# Patient Record
Sex: Female | Born: 1950 | Race: White | Hispanic: No | Marital: Married | Smoking: Former smoker
Health system: Southern US, Community
[De-identification: ages and names within clinical notes are randomized; demographics above are authoritative.]

## PROBLEM LIST (undated history)

## (undated) DIAGNOSIS — Z9221 Personal history of antineoplastic chemotherapy: Secondary | ICD-10-CM

## (undated) DIAGNOSIS — C50919 Malignant neoplasm of unspecified site of unspecified female breast: Secondary | ICD-10-CM

## (undated) DIAGNOSIS — J984 Other disorders of lung: Secondary | ICD-10-CM

## (undated) HISTORY — PX: MASTECTOMY: SHX3

## (undated) HISTORY — PX: AUGMENTATION MAMMAPLASTY: SUR837

## (undated) HISTORY — DX: Malignant neoplasm of unspecified site of unspecified female breast: C50.919

## (undated) HISTORY — DX: Personal history of antineoplastic chemotherapy: Z92.21

## (undated) HISTORY — DX: Other disorders of lung: J98.4

---

## 2016-08-19 LAB — LIPID PANEL
Cholesterol: 241 mg/dL — AB (ref 0–200)
HDL: 70 mg/dL (ref 35–70)
LDL CALC: 137 mg/dL
LDL/HDL RATIO: 2
Triglycerides: 168 mg/dL — AB (ref 40–160)

## 2016-08-19 LAB — HEPATIC FUNCTION PANEL
ALT: 17 U/L (ref 7–35)
AST: 13 U/L (ref 13–35)
Alkaline Phosphatase: 77 U/L (ref 25–125)
Bilirubin, Total: 0.3 mg/dL

## 2016-08-19 LAB — CBC AND DIFFERENTIAL
HEMATOCRIT: 39 % (ref 36–46)
HEMOGLOBIN: 12.9 g/dL (ref 12.0–16.0)
Neutrophils Absolute: 4 /uL
Platelets: 334 10*3/uL (ref 150–399)
WBC: 7.7 10^3/mL

## 2016-08-19 LAB — BASIC METABOLIC PANEL
BUN: 17 mg/dL (ref 4–21)
Creatinine: 0.9 mg/dL (ref 0.5–1.1)
Glucose: 91 mg/dL
Potassium: 4.9 mmol/L (ref 3.4–5.3)
Sodium: 141 mmol/L (ref 137–147)

## 2016-09-11 LAB — HM MAMMOGRAPHY

## 2017-03-20 ENCOUNTER — Encounter: Payer: Self-pay | Admitting: Physician Assistant

## 2017-03-20 ENCOUNTER — Ambulatory Visit (INDEPENDENT_AMBULATORY_CARE_PROVIDER_SITE_OTHER): Payer: Federal, State, Local not specified - PPO | Admitting: Physician Assistant

## 2017-03-20 VITALS — BP 142/67 | HR 90 | Ht 68.0 in | Wt 194.0 lb

## 2017-03-20 DIAGNOSIS — Z1211 Encounter for screening for malignant neoplasm of colon: Secondary | ICD-10-CM

## 2017-03-20 DIAGNOSIS — Z800 Family history of malignant neoplasm of digestive organs: Secondary | ICD-10-CM | POA: Diagnosis not present

## 2017-03-20 DIAGNOSIS — R1010 Upper abdominal pain, unspecified: Secondary | ICD-10-CM

## 2017-03-20 DIAGNOSIS — Z860100 Personal history of colon polyps, unspecified: Secondary | ICD-10-CM

## 2017-03-20 DIAGNOSIS — K5904 Chronic idiopathic constipation: Secondary | ICD-10-CM

## 2017-03-20 MED ORDER — PLECANATIDE 3 MG PO TABS: 1.0000 | ORAL_TABLET | Freq: Every day | ORAL | 2 refills | Status: DC

## 2017-03-20 NOTE — Patient Instructions (Signed)
Plecanatide oral tablets What is this medicine? PLECANATIDE (ple kan a tide) is used to treat chronic constipation. This medicine may be used for other purposes; ask your health care provider or pharmacist if you have questions. COMMON BRAND NAME(S): TRULANCE What should I tell my health care provider before I take this medicine? They need to know if you have any of these conditions: -now have diarrhea or have diarrhea often -stomach or intestinal disease, including bowel obstruction or abdominal adhesions -an unusual or allergic reaction to plecanatide, other medicines, foods, dyes, or preservatives -pregnant or trying to get pregnant -breast-feeding How should I use this medicine? Take this medicine by mouth with a glass of water. Follow the directions on the prescription label. Swallow tablets whole. You can take this medicine with or without food. Take your medicine at regular intervals. Do not take your medicine more often than directed. Do not stop taking except on your doctor's advice. A special MedGuide will be given to you by the pharmacist with each prescription and refill. Be sure to read this information carefully each time. Talk to your pediatrician regarding the use of this medicine in children. This medicine is not approved for use in children. Overdosage: If you think you have taken too much of this medicine contact a poison control center or emergency room at once. NOTE: This medicine is only for you. Do not share this medicine with others. What if I miss a dose? If you miss a dose, just skip that dose. Wait until your next dose, and take only that dose. Do not take double or extra doses. What may interact with this medicine? -certain medicines for bowel problems or bladder incontinence (these can cause constipation) This list may not describe all possible interactions. Give your health care provider a list of all the medicines, herbs, non-prescription drugs, or dietary  supplements you use. Also tell them if you smoke, drink alcohol, or use illegal drugs. Some items may interact with your medicine. What should I watch for while using this medicine? Visit your doctor for regular check ups. Tell your doctor if your symptoms do not get better or if they get worse. Diarrhea is a common side effect of this medicine. It often begins within 2 weeks of starting this medicine. Stop taking this medicine and call your doctor if you get severe diarrhea. What side effects may I notice from receiving this medicine? Side effects that you should report to your doctor or health care professional as soon as possible: -allergic reactions like skin rash, itching or hives, swelling of the face, lips, or tongue -fever or chills; cough; sore throat; pain or trouble passing urine -severe or prolonged diarrhea Side effects that usually do not require medical attention (Report these to your doctor or health care professional if they continue or are bothersome): -bloating -gas This list may not describe all possible side effects. Call your doctor for medical advice about side effects. You may report side effects to FDA at 1-800-FDA-1088. Where should I keep my medicine? Keep out of the reach of children. Store at room temperature between 20 and 25 degrees C (68 and 77 degrees F). Keep this medicine in the original container. Keep tightly closed in a dry place. Do not remove the desiccant packet from the bottle, it helps to protect your medicine from moisture. Throw away any unused medicine after the expiration date. NOTE: This sheet is a summary. It may not cover all possible information. If you have questions about this  medicine, talk to your doctor, pharmacist, or health care provider.  2018 Elsevier/Gold Standard (2015-12-17 16:26:13)

## 2017-03-20 NOTE — Progress Notes (Signed)
   Subjective:    Patient ID: Angelica Hoover, female    DOB: 10-Feb-1951, 66 y.o.   MRN: [id number]  HPI  Pt is a 66 yo female who presents to the clinic to establish care.   .. Active Ambulatory Problems    Diagnosis Date Noted  . Chronic idiopathic constipation 03/22/2017  . History of colon polyps 03/22/2017  . Family history of colon cancer 03/22/2017  . Family history of pancreatic cancer 03/22/2017  . Pain of upper abdomen 03/22/2017   Resolved Ambulatory Problems    Diagnosis Date Noted  . No Resolved Ambulatory Problems   No Additional Past Medical History   Mother died of  colon cancer at age of  74. Father died of pancreatic cancer at age of 83.Brother had colon cancer and died at age 52.   Pt had completely normal bowel movements until colonoscopy 3 years ago. 2 polyps were found with repeat in 3 years colonscopy. Needs referall. Now bowel movements are hard and difficult to pass. She is on fibers and vitamins. She can go up to 10 days with no bowel movements. She is drinkin about 64oz of water a day. She drinks 2 cups of black tea and 2 cuffs of green tea a day. Tried miralax, probiotic, fiber chews, and colon cleanse. Denies any acid reflux. Denies any melena or hematochezia.       Review of Systems  All other systems reviewed and are negative.      Objective:   Physical Exam  Constitutional: She is oriented to person, place, and time. She appears well-developed and well-nourished.  HENT:  Head: Normocephalic and atraumatic.  Cardiovascular: Normal rate, regular rhythm and normal heart sounds.   Pulmonary/Chest: Effort normal and breath sounds normal.  Abdominal:  Slightly distended abdomen.  Tenderness diffusely to RUQ, epigastric area, in all 4 quandrants. No rebound or guarding.   Neurological: She is alert and oriented to person, place, and time.  Psychiatric: She has a normal mood and affect. Her behavior is normal.          Assessment & Plan:   Marland KitchenMarland KitchenDiagnoses and all orders for this visit:  Chronic idiopathic constipation -     US Abdomen Complete; Future -     Ambulatory referral to Gastroenterology  Family history of colon cancer -     Ambulatory referral to Gastroenterology  History of colon polyps -     Ambulatory referral to Gastroenterology  Family history of pancreatic cancer -     Ambulatory referral to Gastroenterology  Pain of upper abdomen -     US Abdomen Complete; Future -     Ambulatory referral to Gastroenterology  Colon cancer screening -     Ambulatory referral to Gastroenterology  Other orders -     Plecanatide (TRULANCE) 3 MG TABS; Take 1 tablet by mouth daily.  U/S to evaluate tenderness in upper abdomen.   Pt needs colonoscopy to follow up on polyps of colon.  For constipation trulance given.  Follow up with GI.  Symptomatic care discussed.

## 2017-03-22 DIAGNOSIS — Z800 Family history of malignant neoplasm of digestive organs: Secondary | ICD-10-CM | POA: Insufficient documentation

## 2017-03-22 DIAGNOSIS — R1010 Upper abdominal pain, unspecified: Secondary | ICD-10-CM | POA: Insufficient documentation

## 2017-03-22 DIAGNOSIS — K5904 Chronic idiopathic constipation: Secondary | ICD-10-CM | POA: Insufficient documentation

## 2017-03-22 DIAGNOSIS — Z86010 Personal history of colonic polyps: Secondary | ICD-10-CM | POA: Insufficient documentation

## 2017-03-24 ENCOUNTER — Telehealth: Payer: Self-pay | Admitting: Physician Assistant

## 2017-03-24 NOTE — Telephone Encounter (Signed)
I called Pharmacy to let them know Trulance was approved and they are going to fill medication and let patient know when it is ready for pick up. - CF

## 2017-03-24 NOTE — Telephone Encounter (Signed)
Received fax for PA on Trulance sent through cover my meds and received authorization from 02/22/17 - 03/24/18. - CF

## 2017-03-25 ENCOUNTER — Other Ambulatory Visit: Payer: Self-pay | Admitting: *Deleted

## 2017-03-26 ENCOUNTER — Other Ambulatory Visit: Payer: Federal, State, Local not specified - PPO

## 2017-04-02 ENCOUNTER — Encounter: Payer: Self-pay | Admitting: Physician Assistant

## 2017-04-07 LAB — HM COLONOSCOPY

## 2017-06-01 ENCOUNTER — Encounter: Payer: Self-pay | Admitting: Family Medicine

## 2017-06-01 ENCOUNTER — Ambulatory Visit (INDEPENDENT_AMBULATORY_CARE_PROVIDER_SITE_OTHER): Payer: Federal, State, Local not specified - PPO | Admitting: Family Medicine

## 2017-06-01 VITALS — BP 141/60 | HR 86 | Temp 97.7°F | Wt 195.0 lb

## 2017-06-01 DIAGNOSIS — H60392 Other infective otitis externa, left ear: Secondary | ICD-10-CM

## 2017-06-01 MED ORDER — FLUCONAZOLE 150 MG PO TABS: 150.0000 mg | ORAL_TABLET | Freq: Once | ORAL | 1 refills | Status: AC

## 2017-06-01 MED ORDER — CEFDINIR 300 MG PO CAPS: 300.0000 mg | ORAL_CAPSULE | Freq: Two times a day (BID) | ORAL | 0 refills | Status: DC

## 2017-06-01 MED ORDER — NEOMYCIN-POLYMYXIN-HC 1 % OT SOLN: 3.0000 [drp] | Freq: Four times a day (QID) | OTIC | 0 refills | Status: DC

## 2017-06-01 NOTE — Progress Notes (Signed)
Angelica Hoover is a 66 y.o. female who presents to [facility]: [facility] today for left ear pain. Patient has a one-week history of left-sided ear pain and swelling. Pain radiates to the jaw and is worse with chewing. She denies any fevers or chills vomiting or diarrhea. She has not tried any treatment yet. She cannot think of any injury but has been cleaning her ears with her fingernails and thinks she may have scraped her ear canal.   No past medical history on file. No past surgical history on file. Social History  Substance Use Topics  . Smoking status: Former Research scientist (life sciences)  . Smokeless tobacco: Never Used  . Alcohol use Yes   family history is not on file.  ROS as above:  Medications: Current Outpatient Prescriptions  Medication Sig Dispense Refill  . AMBULATORY NON FORMULARY MEDICATION 1,000 mg. Garcinia Cambogia    . Ascorbic Acid (VITAMIN C) 500 MG CAPS Take 1,000 mg by mouth daily.    . cefdinir (OMNICEF) 300 MG capsule Take 1 capsule (300 mg total) by mouth 2 (two) times daily. 14 capsule 0  . Cholecalciferol (VITAMIN D-3) 5000 units TABS by Does not apply route.    . fluconazole (DIFLUCAN) 150 MG tablet Take 1 tablet (150 mg total) by mouth once. 1 tablet 1  . GLUCOSAMINE SULFATE PO Frequency:   Dosage:0.0     Instructions:  Note:    . linaclotide (LINZESS) 290 MCG CAPS capsule Take 290 mcg by mouth daily.    Marland Kitchen lisinopril (PRINIVIL,ZESTRIL) 20 MG tablet Take 20 mg by mouth.    . NEOMYCIN-POLYMYXIN-HYDROCORTISONE (CORTISPORIN) 1 % SOLN OTIC solution Place 3 drops into the left ear 4 (four) times daily. 10 mL 0  . Probiotic Product ([employer] PROBIOTIC MAXIMUM STRENGTH) CAPS Take 1 capsule by mouth daily.    Marland Kitchen RA KRILL OIL 500 MG CAPS Frequency:   Dosage:0.0     Instructions:  Note:    . vitamin B-12 (CYANOCOBALAMIN) 1000 MCG tablet Take 1,000 mcg by mouth daily.    .  Vitamins/Minerals TABS Frequency:   Dosage:0.0     Instructions:  Note:    . gabapentin (NEURONTIN) 600 MG tablet Take 600 mg by mouth.    . pramipexole (MIRAPEX) 0.25 MG tablet Take 0.25 mg by mouth daily.     No current facility-administered medications for this visit.    Allergies  Allergen Reactions  . Aspirin   . Codeine   . Cortisone     Pending as of 02/15/15  . Penicillins     Health Maintenance Health Maintenance  Topic Date Due  . Hepatitis C Screening  July 30, 1951  . HIV Screening  08/22/1967  . TETANUS/TDAP  08/22/1971  . PAP SMEAR  08/21/1973  . MAMMOGRAM  08/21/2002  . COLONOSCOPY  08/21/2002  . INFLUENZA VACCINE  06/24/2017     Exam:  BP (!) 141/60 Comment: Taking Sudafed  Pulse 86   Temp 97.7 F (36.5 C) (Oral)   Wt 195 lb (88.5 kg)   SpO2 99%   BMI 29.65 kg/m  Gen: Well NAD HEENT: EOMI,  MMM Left external ear canal and area around the jaw is mildly swollen but nonerythematous. The ear canal is tender to touch. The mastoids are nontender bilaterally. The ear canal swollen but the tympanic membrane is visible and non-erythematous. No discharge. Right-sided ear is normal appearing no cervical lymphadenopathy present. Lungs: Normal work of breathing. CTABL Heart: RRR no  MRG Abd: NABS, Soft. Nondistended, Nontender Exts: Brisk capillary refill, warm and well perfused.    No results found for this or any previous visit (from the past 72 hour(s)). No results found.    Assessment and Plan: 66 y.o. female with otitis externa with potential cellulitis. Empiric treatment with Omnicef and Cortisporin ear drops. Patient has no anaphylactic history to penicillin and therefore I think that [place] will be fine. The allergy to steroids is lethargy with injectable steroids. I think these medications will be safe and effective. Recheck if not better.   No orders of the defined types were placed in this encounter.  Meds ordered this encounter  Medications  .  NEOMYCIN-POLYMYXIN-HYDROCORTISONE (CORTISPORIN) 1 % SOLN OTIC solution    Sig: Place 3 drops into the left ear 4 (four) times daily.    Dispense:  10 mL    Refill:  0  . cefdinir (OMNICEF) 300 MG capsule    Sig: Take 1 capsule (300 mg total) by mouth 2 (two) times daily.    Dispense:  14 capsule    Refill:  0  . fluconazole (DIFLUCAN) 150 MG tablet    Sig: Take 1 tablet (150 mg total) by mouth once.    Dispense:  1 tablet    Refill:  1  . linaclotide (LINZESS) 290 MCG CAPS capsule    Sig: Take 290 mcg by mouth daily.  . Probiotic Product (CVS PROBIOTIC MAXIMUM STRENGTH) CAPS    Sig: Take 1 capsule by mouth daily.  . Ascorbic Acid (VITAMIN C) 500 MG CAPS    Sig: Take 1,000 mg by mouth daily.  . vitamin B-12 (CYANOCOBALAMIN) 1000 MCG tablet    Sig: Take 1,000 mcg by mouth daily.  . pramipexole (MIRAPEX) 0.25 MG tablet    Sig: Take 0.25 mg by mouth daily.     Discussed warning signs or symptoms. Please see discharge instructions. Patient expresses understanding.

## 2017-06-01 NOTE — Patient Instructions (Signed)
Thank you for coming in today. Take the oral antibiotics twice daily for 1 week.  Apply the ear drops 4x daily for 1 week.  Take fluconazole pill if you have a yeast infection.   Recheck as needed especially if worsening or not improving.    Otitis Externa Otitis externa is an infection of the outer ear canal. The outer ear canal is the area between the outside of the ear and the eardrum. Otitis externa is sometimes called "swimmer's ear." What are the causes? This condition may be caused by:  Swimming in dirty water.  Moisture in the ear.  An injury to the inside of the ear.  An object stuck in the ear.  A cut or scrape on the outside of the ear.  What increases the risk? This condition is more likely to develop in swimmers. What are the signs or symptoms? The first symptom of this condition is often itching in the ear. Later signs and symptoms include:  Swelling of the ear.  Redness in the ear.  Ear pain. The pain may get worse when you pull on your ear.  Pus coming from the ear.  How is this diagnosed? This condition may be diagnosed by examining the ear and testing fluid from the ear for bacteria and funguses. How is this treated? This condition may be treated with:  Antibiotic ear drops. These are often given for 10-14 days.  Medicine to reduce itching and swelling.  Follow these instructions at home:  If you were prescribed antibiotic ear drops, apply them as told by your health care provider. Do not stop using the antibiotic even if your condition improves.  Take over-the-counter and prescription medicines only as told by your health care provider.  Keep all follow-up visits as told by your health care provider. This is important. How is this prevented?  Keep your ear dry. Use the corner of a towel to dry your ear after you swim or bathe.  Avoid scratching or putting things in your ear. Doing these things can damage the ear canal or remove the protective  wax that lines it, which makes it easier for bacteria and funguses to grow.  Avoid swimming in lakes, polluted water, or pools that may not have the right amount of chlorine.  Consider making ear drops and putting 3 or 4 drops in each ear after you swim. Ask your health care provider about how you can make ear drops. Contact a health care provider if:  You have a fever.  After 3 days your ear is still red, swollen, painful, or draining pus.  Your redness, swelling, or pain gets worse.  You have a severe headache.  You have redness, swelling, pain, or tenderness in the area behind your ear. This information is not intended to replace advice given to you by your health care provider. Make sure you discuss any questions you have with your health care provider. Document Released: 11/10/2005 Document Revised: 12/18/2015 Document Reviewed: 08/20/2015 Elsevier Interactive Patient Education  [employer].

## 2017-08-03 ENCOUNTER — Ambulatory Visit (INDEPENDENT_AMBULATORY_CARE_PROVIDER_SITE_OTHER): Payer: Federal, State, Local not specified - PPO | Admitting: Physician Assistant

## 2017-08-03 ENCOUNTER — Encounter: Payer: Self-pay | Admitting: Physician Assistant

## 2017-08-03 VITALS — BP 148/70 | HR 78 | Wt 187.0 lb

## 2017-08-03 DIAGNOSIS — G2581 Restless legs syndrome: Secondary | ICD-10-CM | POA: Diagnosis not present

## 2017-08-03 DIAGNOSIS — T50905A Adverse effect of unspecified drugs, medicaments and biological substances, initial encounter: Secondary | ICD-10-CM

## 2017-08-03 DIAGNOSIS — Z23 Encounter for immunization: Secondary | ICD-10-CM

## 2017-08-03 DIAGNOSIS — Z853 Personal history of malignant neoplasm of breast: Secondary | ICD-10-CM | POA: Insufficient documentation

## 2017-08-03 DIAGNOSIS — G4733 Obstructive sleep apnea (adult) (pediatric): Secondary | ICD-10-CM

## 2017-08-03 DIAGNOSIS — T887XXA Unspecified adverse effect of drug or medicament, initial encounter: Secondary | ICD-10-CM

## 2017-08-03 MED ORDER — ROPINIROLE HCL 0.25 MG PO TABS: ORAL_TABLET | ORAL | 1 refills | Status: DC

## 2017-08-03 NOTE — Patient Instructions (Addendum)
Restless Legs Syndrome Restless legs syndrome is a condition that causes uncomfortable feelings or sensations in the legs, especially while sitting or lying down. The sensations usually cause an overwhelming urge to move the legs. The arms can also sometimes be affected. The condition can range from mild to severe. The symptoms often interfere with a person's ability to sleep. What are the causes? The cause of this condition is not known. What increases the risk? This condition is more likely to develop in:  People who are older than age 51.  Pregnant women. In general, restless legs syndrome is more common in women than in men.  People who have a family history of the condition.  People who have certain medical conditions, such as iron deficiency, kidney disease, Parkinson disease, or nerve damage.  People who take certain medicines, such as medicines for high blood pressure, nausea, colds, allergies, depression, and some heart conditions.  What are the signs or symptoms? The main symptom of this condition is uncomfortable sensations in the legs. These sensations may be:  Described as pulling, tingling, prickling, throbbing, crawling, or burning.  Worse while you are sitting or lying down.  Worse during periods of rest or inactivity.  Worse at night, often interfering with your sleep.  Accompanied by a very strong urge to move your legs.  Temporarily relieved by movement of your legs.  The sensations usually affect both sides of the body. The arms can also be affected, but this is rare. People who have this condition often have tiredness during the day because of their lack of sleep at night. How is this diagnosed? This condition may be diagnosed based on your description of the symptoms. You may also have tests, including blood tests, to check for other conditions that may lead to your symptoms. In some cases, you may be asked to spend some time in a sleep lab so your sleeping  can be monitored. How is this treated? Treatment for this condition is focused on managing the symptoms. Treatment may include:  Self-help and lifestyle changes.  Medicines.  Follow these instructions at home:  Take medicines only as directed by your health care provider.  Try these methods to get temporary relief from the uncomfortable sensations: ? Massage your legs. ? Walk or stretch. ? Take a cold or hot bath.  Practice good sleep habits. For example, go to bed and get up at the same time every day.  Exercise regularly.  Practice ways of relaxing, such as yoga or meditation.  Avoid caffeine and alcohol.  Do not use any tobacco products, including cigarettes, chewing tobacco, or electronic cigarettes. If you need help quitting, ask your health care provider.  Keep all follow-up visits as directed by your health care provider. This is important. Contact a health care provider if: Your symptoms do not improve with treatment, or they get worse. This information is not intended to replace advice given to you by your health care provider. Make sure you discuss any questions you have with your health care provider. Document Released: 10/31/2002 Document Revised: 04/17/2016 Document Reviewed: 11/06/2014 Elsevier Interactive Patient Education  2018 [employer].   Ropinirole tablets What is this medicine? ROPINIROLE (roe PIN i role) is used to treat the symptoms of Parkinson's disease. It helps to improve muscle control and movement difficulties. It is also used for the treatment of Restless Legs Syndrome. This medicine may be used for other purposes; ask your health care provider or pharmacist if you have questions. COMMON  BRAND NAME(S): Requip What should I tell my health care provider before I take this medicine? They need to know if you have any of these conditions: -dizzy or fainting spells -heart disease -high blood pressure -kidney disease -liver disease -low blood  pressure -sleeping problems -an unusual or allergic reaction to ropinirole, other medicines, foods, dyes, or preservatives -pregnant or trying to get pregnant -breast-feeding How should I use this medicine? Take this medicine by mouth with a glass of water. Follow the directions on the prescription label. You can take it with or without food. If it upsets your stomach, take it with food. Take your doses at regular intervals. Do not take your medicine more often than directed. Do not stop taking this medicine except on your doctor's advice. Stopping this medicine too quickly may cause serious side effects. Talk to your pediatrician regarding the use of this medicine in children. Special care may be needed. Overdosage: If you think you have taken too much of this medicine contact a poison control center or emergency room at once. NOTE: This medicine is only for you. Do not share this medicine with others. What if I miss a dose? If you miss a dose, take it as soon as you can. If it is almost time for your next dose, take only that dose. Do not take double or extra doses. What may interact with this medicine? -ciprofloxacin -female hormones, like estrogens and birth control pills -medicines for depression, anxiety, or psychotic disturbances -metoclopramide -mexiletine -norfloxacin -omeprazole This list may not describe all possible interactions. Give your health care provider a list of all the medicines, herbs, non-prescription drugs, or dietary supplements you use. Also tell them if you smoke, drink alcohol, or use illegal drugs. Some items may interact with your medicine. What should I watch for while using this medicine? Visit your doctor or health care professional for regular checks on your progress. It may be several weeks or months before you feel the full effect of this medicine. You may get drowsy or dizzy. Do not drive, use machinery, or do anything that needs mental alertness until you  know how this drug affects you. Do not stand or sit up quickly, especially if you are an older patient. This reduces the risk of dizzy or fainting spells. Alcohol can increase possible dizziness. Avoid alcoholic drinks. If you find that you have sudden feelings of wanting to sleep during normal activities, like cooking, watching television, or while driving or riding in a car, you should contact your health care professional. Your mouth may get dry. Chewing sugarless gum or sucking hard candy, and drinking plenty of water may help. Contact your doctor if the problem does not go away or is severe. There have been reports of increased sexual urges or other strong urges such as gambling while taking some medicines for Parkinson's disease. If you experience any of these urges while taking this medicine, you should report it to your health care provider as soon as possible. You should check your skin often for changes to moles and new growths while taking this medicine. Call your doctor if you notice any of these changes. What side effects may I notice from receiving this medicine? Side effects that you should report to your doctor or health care professional as soon as possible: -allergic reactions like skin rash, itching or hives, swelling of the face, lips, or tongue -changes in vision -chest pain -confusion -falling asleep during normal activities like driving -fast, irregular heartbeat -feeling faint  or lightheaded, falls -hallucination, loss of contact with reality -joint or muscle pain -loss of bladder control -loss of memory -new or increased gambling urges, sexual urges, uncontrolled spending, binge or compulsive eating, or other urges -pain, tingling, numbness in the hands or feet -shortness of breath, troubled breathing, tightness in chest, or wheezing -signs and symptoms of low blood pressure like dizziness; feeling faint or lightheaded, falls; unusually weak or tired -swelling of the  ankles, feet, hands -uncontrollable head, mouth, neck, arm, or leg movements -vomiting Side effects that usually do not require medical attention (report to your doctor or health care professional if they continue or are bothersome): -dizziness -drowsiness -headache -increased sweating -nausea -tremors This list may not describe all possible side effects. Call your doctor for medical advice about side effects. You may report side effects to FDA at 1-800-FDA-1088. Where should I keep my medicine? Keep out of the reach of children. Store at room temperature between 20 and 25 degrees C (68 and 77 degrees F). Protect from light and moisture. Keep container tightly closed. Throw away any unused medicine after the expiration date. NOTE: This sheet is a summary. It may not cover all possible information. If you have questions about this medicine, talk to your doctor, pharmacist, or health care provider.  2018 Elsevier/Gold Standard (2016-04-28 10:58:05)

## 2017-08-03 NOTE — Progress Notes (Signed)
   Subjective:    Patient ID: Angelica Hoover, female    DOB: 1951-08-14, 66 y.o.   MRN: [id number]  HPI  Pt is a 66 yo female who presents to the clinic to discuss medication. Dr. Rollene Rotunda, her sleep apena provider gave her mirapex to try to replace gabapentin for her RLS. Gabapentin was working great but had gained 30lbs. She has since lost almost 20lbs after being off gabapentin. She started mirapex but she had lost of side effects such as constipation, insomnia, CP. All resolved after stopping. She now needs something for RLS.                                                            .. Active Ambulatory Problems    Diagnosis Date Noted  . Chronic idiopathic constipation 03/22/2017  . History of colon polyps 03/22/2017  . Family history of colon cancer 03/22/2017  . Family history of pancreatic cancer 03/22/2017  . Pain of upper abdomen 03/22/2017  . RLS (restless legs syndrome) 08/03/2017  . History of left breast cancer 08/03/2017  . OSA (obstructive sleep apnea) 08/05/2017   Resolved Ambulatory Problems    Diagnosis Date Noted  . No Resolved Ambulatory Problems   No Additional Past Medical History      Review of Systems  All other systems reviewed and are negative.      Objective:   Physical Exam  Constitutional: She is oriented to person, place, and time. She appears well-developed and well-nourished.  HENT:  Head: Normocephalic and atraumatic.  Cardiovascular: Normal rate, regular rhythm and normal heart sounds.   Pulmonary/Chest: Effort normal and breath sounds normal.  Neurological: She is alert and oriented to person, place, and time.          Assessment & Plan:  Marland KitchenMarland KitchenPatsy was seen today for reaction to mirapex.  Diagnoses and all orders for this visit:  Adverse effect of drug, initial encounter  RLS (restless legs syndrome) -     rOPINIRole (REQUIP) 0.25 MG tablet; Take one to four tablets as needed 1 hour before bed for RLS.  Need for immunization against  influenza -     Flu Vaccine QUAD 36+ mos IM  OSA (obstructive sleep apnea)   .Marland Kitchen Depression screen [facility] 2/9 08/03/2017 03/20/2017  Decreased Interest 0 0  Down, Depressed, Hopeless 0 0  PHQ - 2 Score 0 0   Will try requip. Discussed side effects. Can take up to 4 tablets. HO on other conservative treatments.  Noted other medication reactions.   Ok to make referral to another sleep apnea provider if needs referral. Her doctor is changing practices.

## 2017-08-05 ENCOUNTER — Telehealth: Payer: Self-pay | Admitting: Physician Assistant

## 2017-08-05 DIAGNOSIS — Z9989 Dependence on other enabling machines and devices: Secondary | ICD-10-CM | POA: Insufficient documentation

## 2017-08-05 DIAGNOSIS — G4733 Obstructive sleep apnea (adult) (pediatric): Secondary | ICD-10-CM | POA: Insufficient documentation

## 2017-08-05 NOTE — Telephone Encounter (Signed)
Need mammogram from [facility] in [place]

## 2017-08-18 NOTE — Telephone Encounter (Signed)
Request faxed

## 2017-08-28 ENCOUNTER — Ambulatory Visit (INDEPENDENT_AMBULATORY_CARE_PROVIDER_SITE_OTHER): Payer: Federal, State, Local not specified - PPO | Admitting: Physician Assistant

## 2017-08-28 ENCOUNTER — Encounter: Payer: Self-pay | Admitting: Physician Assistant

## 2017-08-28 VITALS — BP 132/76 | HR 93 | Ht 68.0 in | Wt 187.5 lb

## 2017-08-28 DIAGNOSIS — M170 Bilateral primary osteoarthritis of knee: Secondary | ICD-10-CM | POA: Insufficient documentation

## 2017-08-28 DIAGNOSIS — G2581 Restless legs syndrome: Secondary | ICD-10-CM

## 2017-08-28 DIAGNOSIS — L2389 Allergic contact dermatitis due to other agents: Secondary | ICD-10-CM | POA: Diagnosis not present

## 2017-08-28 DIAGNOSIS — L719 Rosacea, unspecified: Secondary | ICD-10-CM | POA: Insufficient documentation

## 2017-08-28 DIAGNOSIS — I10 Essential (primary) hypertension: Secondary | ICD-10-CM | POA: Insufficient documentation

## 2017-08-28 MED ORDER — LISINOPRIL 20 MG PO TABS: 20.0000 mg | ORAL_TABLET | Freq: Every day | ORAL | 3 refills | Status: DC

## 2017-08-28 MED ORDER — HYDROCORTISONE 2.5 % EX OINT: TOPICAL_OINTMENT | CUTANEOUS | 1 refills | Status: DC

## 2017-08-28 MED ORDER — ROPINIROLE HCL 0.5 MG PO TABS: ORAL_TABLET | ORAL | 1 refills | Status: DC

## 2017-08-28 NOTE — Patient Instructions (Addendum)
Rosacea Rosacea is a long-term (chronic) condition that affects the skin of the face, including the cheeks, nose, brow, and chin. This condition can also affect the eyes. Rosacea causes blood vessels near the surface of the skin to enlarge, which results in redness. What are the causes? The cause of this condition is not known. Certain triggers can make rosacea worse, including:  Hot baths.  Exercise.  Sunlight.  Very hot or cold temperatures.  Hot or spicy foods and drinks.  Drinking alcohol.  Stress.  Taking blood pressure medicine.  Long-term use of topical steroids on the face.  What increases the risk? This condition is more likely to develop in:  People who are older than 65 years of age.  Women.  People who have light-colored skin (light complexion).  People who have a family history of rosacea.  What are the signs or symptoms? Symptoms of this condition include:  Redness of the face.  Red bumps or pimples on the face.  A red, enlarged nose.  Blushing easily.  Red lines on the skin.  Irritated or burning feeling in the eyes.  Swollen eyelids.  How is this diagnosed? This condition is diagnosed with a medical history and physical exam. How is this treated? There is no cure for this condition, but treatment can help to control your symptoms. Your health care provider may recommend that you see a skin specialist (dermatologist). Treatment may include:  Antibiotic medicines that are applied to the skin or taken as a pill.  Laser treatment to improve the appearance of the skin.  Surgery. This is rare.  Your health care provider will also recommend the best way to take care of your skin. Even after your skin improves, you will likely need to continue treatment to prevent your rosacea from coming back. Follow these instructions at home: Skin Care Take care of your skin as told by your health care provider. You may be told to do these things:  Wash  your skin gently two or more times each day.  Use mild soap.  Use a sunscreen or sunblock with SPF 30 or greater.  Use gentle cosmetics that are meant for sensitive skin.  Shave with an electric shaver instead of a blade.  Lifestyle  Try to keep track of what foods trigger this condition. Avoid any triggers. These may include: ? Spicy foods. ? Seafood. ? Cheese. ? Hot liquids. ? Nuts. ? Chocolate. ? Iodized salt.  Do not drink alcohol.  Avoid extremely cold or hot temperatures.  Try to reduce your stress. If you need help, talk with your health care provider.  When you exercise, do these things to stay cool: ? Limit your sun exposure. ? Use a fan. ? Do shorter and more frequent intervals of exercise. General instructions  Keep all follow-up visits as told by your health care provider. This is important.  Take over-the-counter and prescription medicines only as told by your health care provider.  If your eyelids are affected, apply warm compresses to them. Do this as told by your health care provider.  If you were prescribed an antibiotic medicine, apply or take it as told by your health care provider. Do not stop using the antibiotic even if your condition improves. Contact a health care provider if:  Your symptoms get worse.  Your symptoms do not improve after two months of treatment.  You have new symptoms.  You have any changes in vision or you have problems with your eyes, such as   redness or itching.  You feel depressed.  You lose your appetite.  You have trouble concentrating. This information is not intended to replace advice given to you by your health care provider. Make sure you discuss any questions you have with your health care provider. Document Released: 12/18/2004 Document Revised: 04/17/2016 Document Reviewed: 01/17/2015 Elsevier Interactive Patient Education  2018 [place] 500mg  twice a day.

## 2017-08-28 NOTE — Progress Notes (Addendum)
Subjective:    Patient ID: Angelica Hoover, female    DOB: 03-28-1951, 66 y.o.   MRN: [id number]  HPI The patient is a pleasant 66 year old female here today to discuss her restless leg medication, refill lisinopril, and discuss facial acne. She states that her restless leg syndrome occurs throughout the day and at night. She has tried taking her Requip at night as directed, but she also experiences RLS symptoms during the day. She has tried taking two requip in the afternoon and two at night and this has offered some relief of symptoms during the day. She reports that occasionally she is unable to sleep at night because of her symptoms.   Her BP is well controlled on Lisinopril. BP today was 132/76 today.  She also mentioned what she describes as "acne" on the right side of her face. She states that it began one week ago. She believes that a pillow spray she used last week may have caused the reaction. She reports itching and some discomfort of the face. She reports a history of rosacea. She uses Elemis face wash, which she says has helped. She has limited her use of makeup as well.   .. Active Ambulatory Problems    Diagnosis Date Noted  . Chronic idiopathic constipation 03/22/2017  . History of colon polyps 03/22/2017  . Family history of colon cancer 03/22/2017  . Family history of pancreatic cancer 03/22/2017  . Pain of upper abdomen 03/22/2017  . RLS (restless legs syndrome) 08/03/2017  . History of left breast cancer 08/03/2017  . OSA (obstructive sleep apnea) 08/05/2017  . Hypertension 08/28/2017  . Primary osteoarthritis of both knees 08/28/2017  . 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 Rosacea 08/28/2017   Resolved Ambulatory Problems    Diagnosis Date Noted  . No Resolved Ambulatory Problems   No Additional Past Medical History    Review of Systems  All other systems reviewed and are negative.      Objective:   Physical Exam   Constitutional: She is oriented to person, place, and time. She appears well-developed and well-nourished.  HENT:  Head: Normocephalic and atraumatic.  Cardiovascular: Normal rate, regular rhythm and normal heart sounds.   Pulmonary/Chest: Effort normal and breath sounds normal.  Neurological: She is alert and oriented to person, place, and time.  Skin: Skin is warm and dry.  Contact dermatitis on the right side of the face that appears to be healing.   No pustules or comedones.   Psychiatric: She has a normal mood and affect. Her behavior is normal.      Assessment & Plan:  Marland KitchenMarland KitchenPatsy was seen today for medication refill.  Diagnoses and all orders for this visit:  Allergic contact dermatitis due to other agents -     hydrocortisone 2.5 % ointment; Apply to affected area BID.  RLS (restless legs syndrome) -     rOPINIRole (REQUIP) 0.5 MG tablet; Take one-two tablets up to three times a day.  Essential hypertension -     lisinopril (PRINIVIL,ZESTRIL) 20 MG tablet; Take 1 tablet (20 mg total) by mouth daily.  Primary osteoarthritis of both knees  Rosacea  Refill of lisinopril given today. I suspect a contact dermatitis as a result of the pillow spray she used. We will try hydrocortisone ointment mixed in with her moisturizer. We increased the dose of Requip today to 0.5mg . She may take one to two tablets up to three times per day for her  RLS.   Ho given for rosacea triggers. Follow up if would like to have medication treatment.   We also discussed bilateral knee OA. We discussed Orthovisc injections to help with the pain and OA, however given her allergy to cortisone she is hesitant. She will contact her orthopedist to discuss these injections.

## 2017-09-11 ENCOUNTER — Encounter: Payer: Self-pay | Admitting: Physician Assistant

## 2017-10-21 ENCOUNTER — Other Ambulatory Visit: Payer: Self-pay | Admitting: Physician Assistant

## 2017-10-21 DIAGNOSIS — G2581 Restless legs syndrome: Secondary | ICD-10-CM

## 2017-10-29 ENCOUNTER — Other Ambulatory Visit: Payer: Self-pay | Admitting: Physician Assistant

## 2017-10-29 ENCOUNTER — Telehealth: Payer: Self-pay | Admitting: Physician Assistant

## 2017-10-29 DIAGNOSIS — Z780 Asymptomatic menopausal state: Secondary | ICD-10-CM

## 2017-10-29 DIAGNOSIS — Z1239 Encounter for other screening for malignant neoplasm of breast: Secondary | ICD-10-CM

## 2017-10-29 NOTE — Telephone Encounter (Signed)
-----   Message from Leana Roe, RT sent at 10/29/2017  1:21 PM EST ----- Regarding: Bone Density Order Pt called and wanted to schedule her Mammo and Bone Density together. I put the order in just to make it easier on the patient. Would you mind signing off on it please.   Thank you  Apolonio Schneiders

## 2017-11-04 ENCOUNTER — Ambulatory Visit: Payer: Federal, State, Local not specified - PPO

## 2017-11-04 ENCOUNTER — Ambulatory Visit (INDEPENDENT_AMBULATORY_CARE_PROVIDER_SITE_OTHER): Payer: Federal, State, Local not specified - PPO

## 2017-11-04 ENCOUNTER — Other Ambulatory Visit: Payer: Federal, State, Local not specified - PPO

## 2017-11-04 ENCOUNTER — Other Ambulatory Visit: Payer: Self-pay | Admitting: Physician Assistant

## 2017-11-04 ENCOUNTER — Encounter: Payer: Self-pay | Admitting: Physician Assistant

## 2017-11-04 DIAGNOSIS — Z1231 Encounter for screening mammogram for malignant neoplasm of breast: Secondary | ICD-10-CM

## 2017-11-04 DIAGNOSIS — Z1239 Encounter for other screening for malignant neoplasm of breast: Secondary | ICD-10-CM

## 2017-11-04 DIAGNOSIS — M810 Age-related osteoporosis without current pathological fracture: Secondary | ICD-10-CM | POA: Diagnosis not present

## 2017-11-04 DIAGNOSIS — M85852 Other specified disorders of bone density and structure, left thigh: Secondary | ICD-10-CM

## 2017-11-04 NOTE — Telephone Encounter (Signed)
Call pt: osteoporosis via bone density. You should consider medications to increase bone strength such as fosamax or prolia. Would you like to come in and discuss options/side effects/risk/benefits?

## 2017-11-04 NOTE — Progress Notes (Signed)
Call pt: normal mammogram. Follow up in 1 year.

## 2017-11-11 ENCOUNTER — Other Ambulatory Visit: Payer: Federal, State, Local not specified - PPO

## 2017-12-10 ENCOUNTER — Other Ambulatory Visit: Payer: Self-pay | Admitting: *Deleted

## 2017-12-14 ENCOUNTER — Other Ambulatory Visit: Payer: Self-pay | Admitting: Physician Assistant

## 2017-12-14 DIAGNOSIS — G2581 Restless legs syndrome: Secondary | ICD-10-CM

## 2018-02-11 ENCOUNTER — Other Ambulatory Visit: Payer: Self-pay | Admitting: *Deleted

## 2018-02-11 DIAGNOSIS — G2581 Restless legs syndrome: Secondary | ICD-10-CM

## 2018-02-11 MED ORDER — ROPINIROLE HCL 0.5 MG PO TABS: ORAL_TABLET | ORAL | 2 refills | Status: DC

## 2018-05-25 ENCOUNTER — Other Ambulatory Visit: Payer: Self-pay | Admitting: Physician Assistant

## 2018-05-25 DIAGNOSIS — G2581 Restless legs syndrome: Secondary | ICD-10-CM

## 2018-05-28 ENCOUNTER — Other Ambulatory Visit: Payer: Self-pay

## 2018-05-28 DIAGNOSIS — G2581 Restless legs syndrome: Secondary | ICD-10-CM

## 2018-05-28 MED ORDER — ROPINIROLE HCL 0.5 MG PO TABS: ORAL_TABLET | ORAL | 2 refills | Status: DC

## 2018-05-28 NOTE — Telephone Encounter (Signed)
Pt was using [facility], so [place] needs script for medication. Medication is pended with correct pharmacy. -[place]

## 2018-07-01 ENCOUNTER — Other Ambulatory Visit: Payer: Self-pay

## 2018-07-01 ENCOUNTER — Emergency Department
Admission: EM | Admit: 2018-07-01 | Discharge: 2018-07-01 | Disposition: A | Discharge status: Home / Self Care | Payer: Federal, State, Local not specified - PPO | Source: Home / Self Care

## 2018-07-01 ENCOUNTER — Telehealth: Payer: Self-pay

## 2018-07-01 ENCOUNTER — Encounter: Payer: Self-pay | Admitting: Emergency Medicine

## 2018-07-01 DIAGNOSIS — S29012A Strain of muscle and tendon of back wall of thorax, initial encounter: Secondary | ICD-10-CM

## 2018-07-01 MED ORDER — TRAMADOL HCL 50 MG PO TABS: 50.0000 mg | ORAL_TABLET | Freq: Four times a day (QID) | ORAL | 0 refills | Status: DC | PRN

## 2018-07-01 MED ORDER — PREDNISONE 20 MG PO TABS: ORAL_TABLET | ORAL | 1 refills | Status: DC

## 2018-07-01 MED ORDER — CYCLOBENZAPRINE HCL 5 MG PO TABS: 5.0000 mg | ORAL_TABLET | Freq: Every day | ORAL | 0 refills | Status: DC

## 2018-07-01 NOTE — ED Triage Notes (Signed)
Yesterday noticed soreness in neck which worsened today and radiates from base of neck to right shoulder and down spine to waist.

## 2018-07-01 NOTE — Telephone Encounter (Signed)
Pt left msg stating that she would like muscle relaxer called in for a possible pulled muscle.  Called pt back, she reports she woke up with some pain and tension in her neck, down her back, and in her shoulder blades. Denied any injury or trauma. Pt does report she has some pain when taking deep breaths.  I advised pt she needs to be evaluated, sooner rather than later. Pt thinks it is badly pulled muscle but I advised her these could be cardiac SX, especially since it hurts to breathe at times, and generally you do not get pulled muscle from sleeping wrong.  Pt hesitant to be evaluated, but did finally agree to at least go be seen by urgent care. FYI to Covering provider since [place] out of office

## 2018-07-01 NOTE — Telephone Encounter (Signed)
Agree with above 

## 2018-07-01 NOTE — ED Provider Notes (Addendum)
[facility]   [id number] 07/01/18 Arrival Time: 2993   SUBJECTIVE:  Angelica Hoover is a 67 y.o. female who presents to the urgent care with complaint of Yesterday soreness in neck which worsened today and radiates from base of neck to right shoulder and down spine to waist.  Patient had been loading cases of drinks into her trunk when she felt the initial spasm.  It is gotten much more tight and painful over the last 24 hours.  She is tried Tylenol and ibuprofen with no improvement.     Past Medical History:  Diagnosis Date  . Breast cancer ([place])    Left Breast   . Personal history of chemotherapy    No family history on file. Social History   Socioeconomic History  . Marital status: Married    Spouse name: Not on file  . Number of children: Not on file  . Years of education: Not on file  . Highest education level: Not on file  Occupational History  . Not on file  Social Needs  . Financial resource strain: Not on file  . Food insecurity:    Worry: Not on file    Inability: Not on file  . Transportation needs:    Medical: Not on file    Non-medical: Not on file  Tobacco Use  . Smoking status: Former Research scientist (life sciences)  . Smokeless tobacco: Never Used  Substance and Sexual Activity  . Alcohol use: Yes  . Drug use: No  . Sexual activity: Not on file  Lifestyle  . Physical activity:    Days per week: Not on file    Minutes per session: Not on file  . Stress: Not on file  Relationships  . Social connections:    Talks on phone: Not on file    Gets together: Not on file    Attends religious service: Not on file    Active member of club or organization: Not on file    Attends meetings of clubs or organizations: Not on file    Relationship status: Not on file  . Intimate partner violence:    Fear of current or ex partner: Not on file    Emotionally abused: Not on file    Physically abused: Not on file    Forced sexual activity: Not on file  Other Topics Concern  .  Not on file  Social History Narrative  . Not on file   No outpatient medications have been marked as taking for the 07/01/18 encounter [facility] Encounter).   Allergies  Allergen Reactions  . Aspirin   . Codeine   . Cortisone     Pending as of 02/15/15  . Gabapentin     Weight gain.   Christie Nottingham [Pramipexole Dihydrochloride]     Constipated/insomnia/chest pain  . Penicillins       ROS: As per HPI, remainder of ROS negative.   OBJECTIVE:   Vitals:   07/01/18 1718 07/01/18 1720  BP:  (!) 181/84  Pulse: 80   Temp: 97.8 F (36.6 C)   TempSrc: Oral   SpO2:  100%     General appearance: alert; no distress Eyes: PERRL; EOMI; conjunctiva normal HENT: normocephalic; atraumatic; TMs normal, canal normal, external ears normal without trauma; nasal mucosa normal; oral mucosa normal Neck: supple; tender upper aspect of left shoulder blade. Back: no CVA tenderness; tenderness along the paraspinal region of the left shoulder blade Extremities: no cyanosis or edema; symmetrical with no gross deformities Skin: warm and  dry Neurologic: normal gait; grossly normal Psychological: alert and cooperative; normal mood and affect      Labs:  Results for orders placed or performed in visit on 09/11/17  HM MAMMOGRAPHY  Result Value Ref Range   HM Mammogram 0-4 Bi-Rad 0-4 Bi-Rad, Self Reported Normal    Labs Reviewed - No data to display  No results found.     ASSESSMENT & PLAN:  1. Strain of thoracic back region     Meds ordered this encounter  Medications  . cyclobenzaprine (FLEXERIL) 5 MG tablet    Sig: Take 1 tablet (5 mg total) by mouth at bedtime.    Dispense:  7 tablet    Refill:  0  . traMADol (ULTRAM) 50 MG tablet    Sig: Take 1 tablet (50 mg total) by mouth every 6 (six) hours as needed.    Dispense:  15 tablet    Refill:  0    Reviewed expectations re: course of current medical issues. Questions answered. Outlined signs and symptoms indicating need for  more acute intervention. Patient verbalized understanding. After Visit Summary given.       Robyn Haber, MD 07/01/18 [place] [facility]    Robyn Haber, MD 07/01/18 [phone]

## 2018-08-04 ENCOUNTER — Ambulatory Visit (INDEPENDENT_AMBULATORY_CARE_PROVIDER_SITE_OTHER): Payer: Federal, State, Local not specified - PPO | Admitting: Physician Assistant

## 2018-08-04 DIAGNOSIS — Z23 Encounter for immunization: Secondary | ICD-10-CM

## 2018-08-04 NOTE — Addendum Note (Signed)
Addended by: Narda Rutherford on: 08/04/2018 02:10 PM   Modules accepted: Orders

## 2018-09-13 ENCOUNTER — Encounter: Payer: Self-pay | Admitting: Physician Assistant

## 2018-10-08 ENCOUNTER — Other Ambulatory Visit: Payer: Self-pay | Admitting: Physician Assistant

## 2018-10-08 DIAGNOSIS — I10 Essential (primary) hypertension: Secondary | ICD-10-CM

## 2018-11-05 LAB — HM MAMMOGRAPHY

## 2018-11-11 ENCOUNTER — Encounter: Payer: Self-pay | Admitting: Physician Assistant

## 2018-12-09 ENCOUNTER — Other Ambulatory Visit: Payer: Self-pay

## 2018-12-09 ENCOUNTER — Emergency Department
Admission: EM | Admit: 2018-12-09 | Discharge: 2018-12-09 | Disposition: A | Discharge status: Home / Self Care | Payer: Federal, State, Local not specified - PPO | Source: Home / Self Care | Attending: Family Medicine | Admitting: Family Medicine

## 2018-12-09 DIAGNOSIS — R6889 Other general symptoms and signs: Secondary | ICD-10-CM | POA: Diagnosis not present

## 2018-12-09 MED ORDER — OSELTAMIVIR PHOSPHATE 75 MG PO CAPS: 75.0000 mg | ORAL_CAPSULE | Freq: Two times a day (BID) | ORAL | 0 refills | Status: DC

## 2018-12-09 NOTE — ED Provider Notes (Signed)
[facility]    CSN: [id number] Arrival date & time: 12/09/18  0354     History   Chief Complaint Chief Complaint  Patient presents with  . Nasal Congestion  . Cough    with green mucous    HPI Angelica Hoover is a 68 y.o. female.   HPI Angelica Hoover is a 68 y.o. female presenting to UC with c/o productive cough with green mucous, nasal congestion, body aches, and fever of 100.4*F this morning.  Symptoms started 2 days ago. She did receive the flu vaccine. She has tried Tylenol and Advil as needed along with salt water gargles and cough drops with mild relief. Denies n/v/d.    Past Medical History:  Diagnosis Date  . Breast cancer ([place])    Left Breast   . Personal history of chemotherapy     Patient Active Problem List   Diagnosis Date Noted  . Osteoporosis 11/04/2017  . Hypertension 08/28/2017  . Primary osteoarthritis of both knees 08/28/2017  . 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 Rosacea 08/28/2017  . OSA (obstructive sleep apnea) 08/05/2017  . RLS (restless legs syndrome) 08/03/2017  . History of left breast cancer 08/03/2017  . Chronic idiopathic constipation 03/22/2017  . History of colon polyps 03/22/2017  . Family history of colon cancer 03/22/2017  . Family history of pancreatic cancer 03/22/2017  . Pain of upper abdomen 03/22/2017    Past Surgical History:  Procedure Laterality Date  . AUGMENTATION MAMMAPLASTY    . MASTECTOMY Left    1999    OB History   No obstetric history on file.      Home Medications    Prior to Admission medications   Medication Sig Start Date End Date Taking? Authorizing Provider  AMBULATORY NON FORMULARY MEDICATION 1,000 mg. Garcinia Cambogia    [provider]  Ascorbic Acid (VITAMIN C) 500 MG CAPS Take 1,000 mg by mouth daily.    [provider]  Cholecalciferol (VITAMIN D-3) 5000 units TABS by Does not apply route.    [provider]  cyclobenzaprine  (FLEXERIL) 5 MG tablet Take 1 tablet (5 mg total) by mouth at bedtime. 07/01/18   Robyn Haber, MD  GLUCOSAMINE SULFATE PO Frequency:   Dosage:0.0     Instructions:  Note: 02/24/14   [provider]  linaclotide (LINZESS) 290 MCG CAPS capsule Take 290 mcg by mouth daily. 03/24/17   [provider]  lisinopril (PRINIVIL,ZESTRIL) 20 MG tablet Take 1 tablet (20 mg total) by mouth daily. Patient must make a follow-up appointment with PCP for any further refills. 10/11/18   Donella Stade, PA-C  oseltamivir (TAMIFLU) 75 MG capsule Take 1 capsule (75 mg total) by mouth every 12 (twelve) hours. 12/09/18   Noe Gens, PA-C  RA KRILL OIL 500 MG CAPS Frequency:   Dosage:0.0     Instructions:  Note: 02/24/14   [provider]  rOPINIRole (REQUIP) 0.5 MG tablet TAKE 1 TO 2 TABLETS BY MOUTH THREE TIMES DAILY 05/28/18   Donella Stade, PA-C  Vitamins/Minerals TABS Frequency:   Dosage:0.0     Instructions:  Note: 02/24/14   [provider]    Family History History reviewed. No pertinent family history.  Social History Social History   Tobacco Use  . Smoking status: Former Research scientist (life sciences)  . Smokeless tobacco: Never Used  Substance Use Topics  . Alcohol use: Yes  . Drug use: No     Allergies  Aspirin; Codeine; Cortisone; Gabapentin; Mirapex [pramipexole dihydrochloride]; and Penicillins   Review of Systems Review of Systems  Constitutional: Positive for chills, fatigue and fever.  HENT: Positive for congestion and rhinorrhea. Negative for ear pain, sore throat, trouble swallowing and voice change.   Respiratory: Positive for cough. Negative for shortness of breath.   Cardiovascular: Negative for chest pain and palpitations.  Gastrointestinal: Negative for abdominal pain, diarrhea, nausea and vomiting.  Musculoskeletal: Positive for arthralgias, back pain and myalgias.  Skin: Negative for rash.     Physical Exam Triage Vital Signs ED Triage Vitals  Enc Vitals  Group     BP 12/09/18 0900 (!) 153/95     Pulse Rate 12/09/18 0900 (!) 112     Resp 12/09/18 0900 18     Temp 12/09/18 0900 98.1 F (36.7 C)     Temp Source 12/09/18 0900 Oral     SpO2 12/09/18 0900 99 %     Weight 12/09/18 0901 192 lb (87.1 kg)     Height 12/09/18 0901 5\' 8"  (1.727 m)     Head Circumference --      Peak Flow --      Pain Score 12/09/18 0901 0     Pain Loc --      Pain Edu? --      Excl. in [place]? --    No data found.  Updated Vital Signs BP (!) 153/95 (BP Location: Right Arm)   Pulse (!) 112   Temp 98.1 F (36.7 C) (Oral)   Resp 18   Ht 5\' 8"  (1.727 m)   Wt 192 lb (87.1 kg)   SpO2 99%   BMI 29.19 kg/m   Visual Acuity Right Eye Distance:   Left Eye Distance:   Bilateral Distance:    Right Eye Near:   Left Eye Near:    Bilateral Near:     Physical Exam Vitals signs and nursing note reviewed.  Constitutional:      Appearance: Normal appearance. She is well-developed.  HENT:     Head: Normocephalic and atraumatic.     Right Ear: Tympanic membrane normal.     Left Ear: Tympanic membrane normal.     Nose: Nose normal.     Right Sinus: No maxillary sinus tenderness or frontal sinus tenderness.     Left Sinus: No maxillary sinus tenderness or frontal sinus tenderness.     Mouth/Throat:     Lips: Pink.     Mouth: Mucous membranes are moist.     Pharynx: Oropharynx is clear. Uvula midline.  Neck:     Musculoskeletal: Normal range of motion.  Cardiovascular:     Rate and Rhythm: Normal rate.  Pulmonary:     Effort: Pulmonary effort is normal. No respiratory distress.     Breath sounds: Normal breath sounds. No stridor. No wheezing or rhonchi.  Musculoskeletal: Normal range of motion.  Skin:    General: Skin is warm and dry.  Neurological:     Mental Status: She is alert and oriented to person, place, and time.  Psychiatric:        Behavior: Behavior normal.      UC Treatments / Results  Labs (all labs ordered are listed, but only abnormal  results are displayed) Labs Reviewed - No data to display  EKG None  Radiology No results found.  Procedures Procedures (including critical care time)  Medications Ordered in UC Medications - No data to display  Initial Impression / Assessment and Plan /  UC Course  I have reviewed the triage vital signs and the nursing notes.  Pertinent labs & imaging results that were available during my care of the patient were reviewed by me and considered in my medical decision making (see chart for details).     No evidence of bacterial infection at this time. Given active flu-season, discussed flu test despite pt receiving flu vaccine. Pt declined flu test but would like to try empiric treatment with Tamiflu Home care info provided  Final Clinical Impressions(s) / UC Diagnoses   Final diagnoses:  Flu-like symptoms     Discharge Instructions      You may take 500mg  acetaminophen every 4-6 hours or in combination with ibuprofen 400-600mg  every 6-8 hours as needed for pain, inflammation, and fever.  Be sure to well hydrated with clear liquids and get at least 8 hours of sleep at night, preferably more while sick.   Please follow up with family medicine in 1 week if needed.     ED Prescriptions    Medication Sig Dispense Auth. Provider   oseltamivir (TAMIFLU) 75 MG capsule Take 1 capsule (75 mg total) by mouth every 12 (twelve) hours. 10 capsule Noe Gens, PA-C     Controlled Substance Prescriptions [place] Controlled Substance Registry consulted? Not Applicable   Tyrell Antonio 12/09/18 1243

## 2018-12-09 NOTE — ED Triage Notes (Signed)
Pt c/o cough with green mucous, nasal congestion. Says fever was 100.4 this morning. Says her chest feels "like its on fire". Taking tylenol and advil prn. Salt water gargles and cough drops.

## 2018-12-09 NOTE — Discharge Instructions (Signed)
  You may take 500mg acetaminophen every 4-6 hours or in combination with ibuprofen 400-600mg every 6-8 hours as needed for pain, inflammation, and fever.  Be sure to well hydrated with clear liquids and get at least 8 hours of sleep at night, preferably more while sick.   Please follow up with family medicine in 1 week if needed.   

## 2018-12-14 ENCOUNTER — Ambulatory Visit (INDEPENDENT_AMBULATORY_CARE_PROVIDER_SITE_OTHER): Payer: Federal, State, Local not specified - PPO

## 2018-12-14 ENCOUNTER — Ambulatory Visit (INDEPENDENT_AMBULATORY_CARE_PROVIDER_SITE_OTHER): Payer: Federal, State, Local not specified - PPO | Admitting: Physician Assistant

## 2018-12-14 ENCOUNTER — Encounter: Payer: Self-pay | Admitting: Physician Assistant

## 2018-12-14 VITALS — BP 176/93 | HR 108 | Temp 98.3°F | Wt 192.0 lb

## 2018-12-14 DIAGNOSIS — Z130 Encounter for screening for diseases of the blood and blood-forming organs and certain disorders involving the immune mechanism: Secondary | ICD-10-CM

## 2018-12-14 DIAGNOSIS — E782 Mixed hyperlipidemia: Secondary | ICD-10-CM

## 2018-12-14 DIAGNOSIS — I10 Essential (primary) hypertension: Secondary | ICD-10-CM

## 2018-12-14 DIAGNOSIS — J22 Unspecified acute lower respiratory infection: Secondary | ICD-10-CM

## 2018-12-14 DIAGNOSIS — E7800 Pure hypercholesterolemia, unspecified: Secondary | ICD-10-CM | POA: Diagnosis not present

## 2018-12-14 MED ORDER — GUAIFENESIN ER 600 MG PO TB12: 1200.0000 mg | ORAL_TABLET | Freq: Two times a day (BID) | ORAL | Status: DC

## 2018-12-14 MED ORDER — IPRATROPIUM BROMIDE 0.06 % NA SOLN: 2.0000 | Freq: Four times a day (QID) | NASAL | 0 refills | Status: DC | PRN

## 2018-12-14 MED ORDER — BENZONATATE 200 MG PO CAPS: 200.0000 mg | ORAL_CAPSULE | Freq: Every evening | ORAL | 0 refills | Status: DC | PRN

## 2018-12-14 MED ORDER — AZITHROMYCIN 250 MG PO TABS: ORAL_TABLET | ORAL | 0 refills | Status: DC

## 2018-12-14 MED ORDER — LISINOPRIL 20 MG PO TABS: 20.0000 mg | ORAL_TABLET | Freq: Every day | ORAL | 0 refills | Status: DC

## 2018-12-14 NOTE — Progress Notes (Signed)
I personally was present and performed the history, physical exam, and medical decision-making activities of the service and have verified that the service and findings are accurately documented in the student's note.  I personally reviewed patient's chest x-ray today, which showed obliteration of the retrocardiac space suggesting possible consolidation.  Darlyne Russian PA-C

## 2018-12-14 NOTE — Progress Notes (Signed)
Subjective:    Patient ID: Angelica Hoover, female    DOB: 05-07-1951, 68 y.o.   MRN: [id number]  HPI: This is a 68 year old woman who presents to the clinic today with a chief complaint of productive cough of green, blood speckled sputum since last Monday. She also complains of nasal congestion, rhinorrhea, weakness, fatigue, fever, and labored breathing. She went to urgent care last Thursday where she declined a flu swab but was prescribed Tamiflu. Since taking the Tamiflu she states she has felt worse. She has also been taking Tylenol, Advil, and using Afrin.   She is also requesting a refill of her Lisinopril today as she will soon run out.     Review of Systems  Constitutional: Positive for fatigue and fever.  HENT: Positive for congestion, postnasal drip, rhinorrhea and sneezing. Negative for sore throat.   Eyes: Positive for discharge and itching.  Respiratory: Positive for cough and shortness of breath.   Cardiovascular: Negative for chest pain and palpitations.  Gastrointestinal: Negative for abdominal pain, diarrhea, nausea and vomiting.  Musculoskeletal: Positive for myalgias.  Skin: Negative for pallor and rash.       Objective:   Physical Exam Vitals signs reviewed.  Constitutional:      General: She is not in acute distress.    Appearance: She is ill-appearing.  HENT:     Head: Normocephalic and atraumatic.     Right Ear: Tympanic membrane, ear canal and external ear normal. There is no impacted cerumen.     Left Ear: Tympanic membrane, ear canal and external ear normal. There is no impacted cerumen.     Nose: Rhinorrhea present. No congestion.     Mouth/Throat:     Mouth: Mucous membranes are moist.     Pharynx: Oropharynx is clear. No oropharyngeal exudate or posterior oropharyngeal erythema.  Eyes:     General: No scleral icterus.    Extraocular Movements: Extraocular movements intact.     Pupils: Pupils are equal, round, and reactive to light.  Neck:   Musculoskeletal: Normal range of motion and neck supple. No neck rigidity or muscular tenderness.  Cardiovascular:     Rate and Rhythm: Tachycardia present.     Pulses: Normal pulses.  Pulmonary:     Effort: No respiratory distress.     Breath sounds: Normal breath sounds. No wheezing, rhonchi or rales.  Abdominal:     General: Abdomen is flat. Bowel sounds are normal. There is no distension.  Lymphadenopathy:     Cervical: No cervical adenopathy.  Skin:    General: Skin is warm and dry.     Capillary Refill: Capillary refill takes less than 2 seconds.     Coloration: Skin is not jaundiced.     Findings: No bruising or rash.  Neurological:     Mental Status: She is alert.     Coordination: Coordination normal.     Gait: Gait normal.  Psychiatric:        Mood and Affect: Mood normal.        Behavior: Behavior normal.        Thought Content: Thought content normal.    Vitals:   12/14/18 1122  BP: (!) 176/93  Pulse: (!) 108  Temp: 98.3 F (36.8 C)  SpO2: 96%          Assessment & Plan:  Acute lower respiratory infection: Afebrile, mildly tachycardic, no tachypnea, pulse ox 96% on RA at rest. History of blood-tinged sputum. Will treat empirically for  CAP -CXR pending -Azithromycin 500mg  PO x1day, then 250mg  PO x4days -Discontinue Afrin to avoid rebound congestion -Start Guaifenesin 400mg  PO q4hr PRN for chest congestion -Tessalon 100mg  PO QHS prn for cough  Essential hypertension with in office elevated blood pressure reading.  BP Readings from Last 3 Encounters:  12/14/18 (!) 176/93  12/09/18 (!) 153/95  07/01/18 (!) 181/84  -Lisinopril refill given for 1 week. Instructed patient to follow up with [facility] in one week for HTN follow up. Stressed importance of medication adherence and regular follow up. Renal function pending.

## 2018-12-14 NOTE — Patient Instructions (Signed)
For nasal symptoms/sinusitis: - STOP AFRIN. Avoid oral decongestants like Sudafed. You can use Coricidin HBP - nasal saline rinses / netti pot several times per day (do this prior to nasal spray) - prescription Atrovent nasal spray: 2 sprays each nostril, up to 4 times per day as needed - warm facial compresses - oral decongestants and antihistamines like Claritin-D and Zyrtec-D may help dry up secretions (caution using decongestants if you have high blood pressure, heart disease or kidney disease) - for sinus headache: Tylenol 1000mg  every 8 hours as needed. Alternate with Ibuprofen 600mg  every 6 hours  For sore throat: - Tylenol 1000mg  every 8 hours as needed for throat pain. Alternate with Ibuprofen 600mg  every 6 hours - Cepacol throat lozenges and/or Chloraseptic spray - Warm salt water gargles  For cough: - Cough is a protective mechanism and an important part of fighting an infection. I encourage you to avoid suppressing your cough with medication during the day if possible.  - Mucinex with at least 8 oz. of water can loosen chest congestion and make cough more productive, which means you will actually cough less - Okay to use a cough suppressant at bedtime in order to rest (Nyquil, Delsym, Robitussin, etc.)  Note: follow package instructions for all over-the-counter medications. If using multi-symptom medications (Dayquil, Theraflu, etc.), check the label for duplicate drug ingredients.   Viral Respiratory Infection A respiratory infection is an illness that affects part of the respiratory system, such as the lungs, nose, or throat. A respiratory infection that is caused by a virus is called a viral respiratory infection. Common types of viral respiratory infections include:  A cold.  The flu (influenza).  A respiratory syncytial virus (RSV) infection. What are the causes? This condition is caused by a virus. What are the signs or symptoms? Symptoms of this condition  include:  A stuffy or runny nose.  Yellow or green nasal discharge.  A cough.  Sneezing.  Fatigue.  Achy muscles.  A sore throat.  Sweating or chills.  A fever.  A headache. How is this diagnosed? This condition may be diagnosed based on:  Your symptoms.  A physical exam.  Testing of nasal swabs. How is this treated? This condition may be treated with medicines, such as:  Antiviral medicine. This may shorten the length of time a person has symptoms.  Expectorants. These make it easier to cough up mucus.  Decongestant nasal sprays.  Acetaminophen or NSAIDs to relieve fever and pain. Antibiotic medicines are not prescribed for viral infections. This is because antibiotics are designed to kill bacteria. They are not effective against viruses. Follow these instructions at home:  Managing pain and congestion  Take over-the-counter and prescription medicines only as told by your health care provider.  If you have a sore throat, gargle with a salt-water mixture 3-4 times a day or as needed. To make a salt-water mixture, completely dissolve -1 tsp of salt in 1 cup of warm water.  Use nose drops made from salt water to ease congestion and soften raw skin around your nose.  Drink enough fluid to keep your urine pale yellow. This helps prevent dehydration and helps loosen up mucus. General instructions  Rest as much as possible.  Do not drink alcohol.  Do not use any products that contain nicotine or tobacco, such as cigarettes and e-cigarettes. If you need help quitting, ask your health care provider.  Keep all follow-up visits as told by your health care provider. This is important. How  is this prevented?   Get an annual flu shot. You may get the flu shot in late summer, fall, or winter. Ask your health care provider when you should get your flu shot.  Avoid exposing others to your respiratory infection. ? Stay home from work or school as told by your health  care provider. ? Wash your hands with soap and water often, especially after you cough or sneeze. If soap and water are not available, use alcohol-based hand sanitizer.  Avoid contact with people who are sick during cold and flu season. This is generally fall and winter. Contact a health care provider if:  Your symptoms last for 10 days or longer.  Your symptoms get worse over time.  You have a fever.  You have severe sinus pain in your face or forehead.  The glands in your jaw or neck become very swollen. Get help right away if you:  Feel pain or pressure in your chest.  Have shortness of breath.  Faint or feel like you will faint.  Have severe and persistent vomiting.  Feel confused or disoriented. Summary  A respiratory infection is an illness that affects part of the respiratory system, such as the lungs, nose, or throat. A respiratory infection that is caused by a virus is called a viral respiratory infection.  Common types of viral respiratory infections are a cold, influenza, and respiratory syncytial virus (RSV) infection.  Symptoms of this condition include a stuffy or runny nose, cough, sneezing, fatigue, achy muscles, sore throat, and fevers or chills.  Antibiotic medicines are not prescribed for viral infections. This is because antibiotics are designed to kill bacteria. They are not effective against viruses. This information is not intended to replace advice given to you by your health care provider. Make sure you discuss any questions you have with your health care provider. Document Released: 08/20/2005 Document Revised: 12/21/2017 Document Reviewed: 12/21/2017 Elsevier Interactive Patient Education  2019 [employer].

## 2018-12-15 ENCOUNTER — Encounter: Payer: Self-pay | Admitting: Physician Assistant

## 2018-12-15 DIAGNOSIS — J984 Other disorders of lung: Secondary | ICD-10-CM | POA: Insufficient documentation

## 2018-12-15 HISTORY — DX: Other disorders of lung: J98.4

## 2018-12-21 ENCOUNTER — Ambulatory Visit (INDEPENDENT_AMBULATORY_CARE_PROVIDER_SITE_OTHER): Payer: Federal, State, Local not specified - PPO | Admitting: Physician Assistant

## 2018-12-21 ENCOUNTER — Encounter: Payer: Self-pay | Admitting: Physician Assistant

## 2018-12-21 VITALS — BP 152/87 | HR 83 | Ht 68.0 in | Wt 191.0 lb

## 2018-12-21 DIAGNOSIS — I10 Essential (primary) hypertension: Secondary | ICD-10-CM | POA: Diagnosis not present

## 2018-12-21 DIAGNOSIS — R0981 Nasal congestion: Secondary | ICD-10-CM | POA: Diagnosis not present

## 2018-12-21 DIAGNOSIS — J069 Acute upper respiratory infection, unspecified: Secondary | ICD-10-CM | POA: Diagnosis not present

## 2018-12-21 DIAGNOSIS — E663 Overweight: Secondary | ICD-10-CM | POA: Diagnosis not present

## 2018-12-21 MED ORDER — METHYLPREDNISOLONE 4 MG PO TBPK: ORAL_TABLET | ORAL | 0 refills | Status: DC

## 2018-12-21 MED ORDER — LISINOPRIL-HYDROCHLOROTHIAZIDE 20-25 MG PO TABS: 1.0000 | ORAL_TABLET | Freq: Every day | ORAL | 1 refills | Status: DC

## 2018-12-21 MED ORDER — FLUTICASONE PROPIONATE 50 MCG/ACT NA SUSP: 2.0000 | Freq: Every day | NASAL | 6 refills | Status: DC

## 2018-12-21 NOTE — Progress Notes (Signed)
Subjective:    Patient ID: Angelica Hoover, female    DOB: March 17, 1951, 68 y.o.   MRN: [id number]  HPI  Pt is a 68 yo female with HTN who presents to the clinic to follow up on acute lower respiratory infection on 12/14/18. She was given zpak, mucinex, tessalon. She finished zpak on 12/18/18. She is feeling much better but still very congested. Her ears continue to pop as well. She still has a residual cough. No fever, chills, body aches, wheezing.   Her BP was very elevated at last visit. She has been checking at home as well and even on lisinopril getting elevated 150's/90's readings. No CP, palpitations, vision changes or headaches. She knows she needs to lose weight. She is very interested in trying something. She is not currently exercising. She feels like her diet is pretty healthy.   She did stop requip for RLS. Her RLS is actually better off the medication.   .. Active Ambulatory Problems    Diagnosis Date Noted  . Chronic idiopathic constipation 03/22/2017  . History of colon polyps 03/22/2017  . Family history of colon cancer 03/22/2017  . Family history of pancreatic cancer 03/22/2017  . Pain of upper abdomen 03/22/2017  . RLS (restless legs syndrome) 08/03/2017  . History of left breast cancer 08/03/2017  . OSA (obstructive sleep apnea) 08/05/2017  . Essential hypertension 08/28/2017  . Primary osteoarthritis of both knees 08/28/2017  . 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 Rosacea 08/28/2017  . Osteoporosis 11/04/2017  . 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 Overweight (BMI 25.0-29.9) 12/22/2018   Resolved Ambulatory Problems    Diagnosis Date Noted  . No Resolved Ambulatory Problems   Past Medical History:  Diagnosis Date  . Breast cancer ([place])   . Personal history of chemotherapy        Review of Systems    see HPI.  Objective:   Physical Exam Vitals signs reviewed.  Constitutional:      Appearance: Normal appearance.  HENT:     Head:  Normocephalic and atraumatic.     Right Ear: Ear canal and external ear normal.     Left Ear: Tympanic membrane, ear canal and external ear normal.     Ears:     Comments: Right middle ear effusion.     Nose: Congestion present.     Mouth/Throat:     Mouth: Mucous membranes are moist.     Pharynx: No oropharyngeal exudate or posterior oropharyngeal erythema.  Eyes:     Conjunctiva/sclera: Conjunctivae normal.  Cardiovascular:     Rate and Rhythm: Normal rate and regular rhythm.     Pulses: Normal pulses.  Pulmonary:     Effort: Pulmonary effort is normal.     Breath sounds: Normal breath sounds. No wheezing or rhonchi.  Neurological:     Mental Status: She is alert.  Psychiatric:        Mood and Affect: Mood normal.        Behavior: Behavior normal.           Assessment & Plan:  Marland KitchenMarland KitchenPatsy was seen today for follow-up.  Diagnoses and all orders for this visit:  Acute upper respiratory infection -     methylPREDNISolone (MEDROL DOSEPAK) 4 MG TBPK tablet; Take as directed by package insert. -     fluticasone (FLONASE) 50 MCG/ACT nasal spray; Place 2 sprays into both nostrils daily.  Nasal congestion -     methylPREDNISolone (MEDROL DOSEPAK) 4  MG TBPK tablet; Take as directed by package insert. -     fluticasone (FLONASE) 50 MCG/ACT nasal spray; Place 2 sprays into both nostrils daily.  Essential hypertension -     lisinopril-hydrochlorothiazide (PRINZIDE,ZESTORETIC) 20-25 MG tablet; Take 1 tablet by mouth daily.  Overweight (BMI 25.0-29.9)   Reassured patient does seem to be feeling better. Pt has some fluid in right ear and congestion. Medrol dose pak to start with flonase. Reassured lingering cough and congestion can last for a few days to weeks after infection. Call with worsening symptoms.   BP still elevated increase to lisinopril/HCTZ. Discussed side effects. She has done really well with lisinopril. Follow up in 1 month.   Marland Kitchen.Discussed low carb diet with 1500  calories and 80g of protein.  Exercising at least 150 minutes a week.  [employer] could be a Microbiologist.  Discussed medications. Will call insurance to see if cover.  She does have HTN as a comorbidity with BMI of 29.   Marland KitchenMarland KitchenSpent 30 minutes with patient and greater than 50 percent of visit spent counseling patient regarding treatment plan.

## 2018-12-21 NOTE — Patient Instructions (Signed)
saxenda qsymia contrave

## 2018-12-22 ENCOUNTER — Telehealth: Payer: Self-pay | Admitting: Physician Assistant

## 2018-12-22 DIAGNOSIS — E663 Overweight: Secondary | ICD-10-CM | POA: Insufficient documentation

## 2018-12-22 NOTE — Telephone Encounter (Signed)
Per pt colonoscopy done recently by Dr. Prudence Davidson?

## 2018-12-23 NOTE — Telephone Encounter (Signed)
I've been searching, and I cannot find a doctor with that name in the area. I called the patient and left a voicemail for her to call us back and clarify exactly where she went to have the colonoscopy done. Call back information provided.

## 2018-12-23 NOTE — Telephone Encounter (Signed)
Ms. Furrow called. She had colonoscopy done on Apr 07, 2017 at [facility] in [facility], it was done by Dr. Esaw Dace

## 2018-12-23 NOTE — Telephone Encounter (Signed)
Continued.Marland KitchenMarland Kitchenphone number [phone], fax number [phone].  Thanks.

## 2018-12-24 NOTE — Telephone Encounter (Signed)
Thank you so much!! Request for records is being faxed over.

## 2018-12-29 ENCOUNTER — Other Ambulatory Visit: Payer: Self-pay | Admitting: Physician Assistant

## 2018-12-29 ENCOUNTER — Ambulatory Visit (INDEPENDENT_AMBULATORY_CARE_PROVIDER_SITE_OTHER): Payer: Federal, State, Local not specified - PPO | Admitting: Physician Assistant

## 2018-12-29 ENCOUNTER — Encounter: Payer: Self-pay | Admitting: Physician Assistant

## 2018-12-29 VITALS — BP 121/68 | HR 98 | Temp 98.0°F | Ht 68.0 in | Wt 191.0 lb

## 2018-12-29 DIAGNOSIS — J22 Unspecified acute lower respiratory infection: Secondary | ICD-10-CM

## 2018-12-29 DIAGNOSIS — R05 Cough: Secondary | ICD-10-CM | POA: Diagnosis not present

## 2018-12-29 DIAGNOSIS — J4521 Mild intermittent asthma with (acute) exacerbation: Secondary | ICD-10-CM

## 2018-12-29 DIAGNOSIS — R059 Cough, unspecified: Secondary | ICD-10-CM

## 2018-12-29 MED ORDER — FAMOTIDINE 20 MG PO TABS: 20.0000 mg | ORAL_TABLET | Freq: Two times a day (BID) | ORAL | 3 refills | Status: AC

## 2018-12-29 MED ORDER — OMEPRAZOLE 40 MG PO CPDR: 40.0000 mg | DELAYED_RELEASE_CAPSULE | Freq: Every day | ORAL | 3 refills | Status: DC

## 2018-12-29 NOTE — Progress Notes (Signed)
Subjective:    Patient ID: Angelica Hoover, female    DOB: 10-03-1951, 68 y.o.   MRN: [id number]  HPI  Pt is a 68 yo female who presents to the clinic with persistent cough and now a shortness of breath feeling. She just feels like she cannot get a deep breath. She had the flu early this month and then went into chest. CXR is normal. She finished medrol dose pack but she cannot tolerate oral prednisone. She has a hx of smoking but does not currently smoke. She is worried about COPd.   .. Active Ambulatory Problems    Diagnosis Date Noted  . Chronic idiopathic constipation 03/22/2017  . History of colon polyps 03/22/2017  . Family history of colon cancer 03/22/2017  . Family history of pancreatic cancer 03/22/2017  . Pain of upper abdomen 03/22/2017  . RLS (restless legs syndrome) 08/03/2017  . History of left breast cancer 08/03/2017  . OSA (obstructive sleep apnea) 08/05/2017  . Essential hypertension 08/28/2017  . Primary osteoarthritis of both knees 08/28/2017  . 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 Rosacea 08/28/2017  . Osteoporosis 11/04/2017  . 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 Overweight (BMI 25.0-29.9) 12/22/2018   Resolved Ambulatory Problems    Diagnosis Date Noted  . No Resolved Ambulatory Problems   Past Medical History:  Diagnosis Date  . Breast cancer ([place])   . Personal history of chemotherapy      Review of Systems  All other systems reviewed and are negative.      Objective:   Physical Exam Vitals signs reviewed.  Constitutional:      Appearance: Normal appearance.  HENT:     Head: Normocephalic and atraumatic.     Right Ear: Tympanic membrane and ear canal normal.     Left Ear: Tympanic membrane and ear canal normal.     Nose: Congestion present.     Mouth/Throat:     Mouth: Mucous membranes are moist.     Pharynx: No oropharyngeal exudate.  Eyes:     Conjunctiva/sclera: Conjunctivae normal.  Cardiovascular:     Rate  and Rhythm: Normal rate and regular rhythm.     Pulses: Normal pulses.  Pulmonary:     Effort: Pulmonary effort is normal.     Breath sounds: Normal breath sounds.  Lymphadenopathy:     Cervical: No cervical adenopathy.  Neurological:     General: No focal deficit present.     Mental Status: She is alert and oriented to person, place, and time.  Psychiatric:        Mood and Affect: Mood normal.        Behavior: Behavior normal.           Assessment & Plan:  Marland KitchenMarland KitchenPatsy was seen today for follow-up.  Diagnoses and all orders for this visit:  Mild intermittent reactive airway disease with acute exacerbation  Cough -     famotidine (PEPCID) 20 MG tablet; Take 1 tablet (20 mg total) by mouth 2 (two) times daily. -     omeprazole (PRILOSEC) 40 MG capsule; Take 1 capsule (40 mg total) by mouth daily.  pt still sounds congested and peak flows were decreased. duoneb given. Peak flows increased and patient does feel better. She is still under where we would expect her to be.  I do think she has some post viral/reactive airway disease cough.  Sample of symbicort 2 puffs twice a day for next month.  Will see if  treating GERD can help cough as well Continue to use atrovent nasal spray.  Once she is stable could consider spironmetry in office.   Follow up in 1 month.

## 2018-12-29 NOTE — Patient Instructions (Addendum)
symbicort 2 puffs twice a day. Rinse mouth out after.    Omeprazole take once daily.  pepcid twice a day.    Costochondritis Costochondritis is swelling and irritation (inflammation) of the tissue (cartilage) that connects your ribs to your breastbone (sternum). This causes pain in the front of your chest. Usually, the pain:  Starts gradually.  Is in more than one rib. This condition usually goes away on its own over time. Follow these instructions at home:  Do not do anything that makes your pain worse.  If directed, put ice on the painful area: ? Put ice in a plastic bag. ? Place a towel between your skin and the bag. ? Leave the ice on for 20 minutes, 2-3 times a day.  If directed, put heat on the affected area as often as told by your doctor. Use the heat source that your doctor tells you to use, such as a moist heat pack or a heating pad. ? Place a towel between your skin and the heat source. ? Leave the heat on for 20-30 minutes. ? Take off the heat if your skin turns bright red. This is very important if you cannot feel pain, heat, or cold. You may have a greater risk of getting burned.  Take over-the-counter and prescription medicines only as told by your doctor.  Return to your normal activities as told by your doctor. Ask your doctor what activities are safe for you.  Keep all follow-up visits as told by your doctor. This is important. Contact a doctor if:  You have chills or a fever.  Your pain does not go away or it gets worse.  You have a cough that does not go away. Get help right away if:  You are short of breath. This information is not intended to replace advice given to you by your health care provider. Make sure you discuss any questions you have with your health care provider. Document Released: 04/28/2008 Document Revised: 05/30/2016 Document Reviewed: 03/05/2016 Elsevier Interactive Patient Education  2019 [employer].

## 2018-12-31 ENCOUNTER — Encounter: Payer: Self-pay | Admitting: Physician Assistant

## 2019-01-03 ENCOUNTER — Telehealth: Payer: Self-pay

## 2019-01-03 NOTE — Telephone Encounter (Signed)
Patient advised of recommendations. The cough and shortness of breath is better.

## 2019-01-03 NOTE — Telephone Encounter (Signed)
Angelica Hoover called and left a message stating she is having an allergic reaction to Symbicort. Her legs and feet feel tingly, she feels woozy and weak. I called patient to get more information and it went to voicemail. Left a message for a return call.

## 2019-01-03 NOTE — Telephone Encounter (Signed)
Certainly stop medication. Can add to medication list. How is her SOB/cough?

## 2019-01-03 NOTE — Telephone Encounter (Signed)
Stay off and keep me up dated. We could consider another inhaler but don't want to if improving.

## 2019-01-04 MED ORDER — IPRATROPIUM-ALBUTEROL 20-100 MCG/ACT IN AERS: 1.0000 | INHALATION_SPRAY | Freq: Four times a day (QID) | RESPIRATORY_TRACT | 1 refills | Status: DC | PRN

## 2019-01-04 NOTE — Telephone Encounter (Signed)
Angelica Hoover called back and states she did stop the Symbicort. However, she did wake up worse today and would like an inhaler. Prefers one without a steroid, if any.

## 2019-01-04 NOTE — Telephone Encounter (Signed)
Sent combivent.

## 2019-01-04 NOTE — Telephone Encounter (Signed)
Left message advising of recommendations.  

## 2019-01-05 ENCOUNTER — Other Ambulatory Visit: Payer: Self-pay | Admitting: Physician Assistant

## 2019-01-05 DIAGNOSIS — J22 Unspecified acute lower respiratory infection: Secondary | ICD-10-CM

## 2019-01-05 NOTE — Telephone Encounter (Signed)
Left message advising patient of new prescription.  

## 2019-01-07 MED ORDER — ALBUTEROL SULFATE HFA 108 (90 BASE) MCG/ACT IN AERS: 2.0000 | INHALATION_SPRAY | Freq: Four times a day (QID) | RESPIRATORY_TRACT | 0 refills | Status: DC | PRN

## 2019-01-07 NOTE — Addendum Note (Signed)
Addended by: Donella Stade on: 01/07/2019 04:57 PM   Modules accepted: Orders

## 2019-01-07 NOTE — Telephone Encounter (Signed)
I sent the albuterol plain to pharmacy to try.  Can we please call patient to let her know.

## 2019-01-07 NOTE — Telephone Encounter (Signed)
She can't afford the combivent. She still has the cough and congestion.

## 2019-01-10 NOTE — Telephone Encounter (Signed)
Left VM with update.  

## 2019-01-12 ENCOUNTER — Other Ambulatory Visit: Payer: Self-pay | Admitting: Physician Assistant

## 2019-01-12 DIAGNOSIS — I10 Essential (primary) hypertension: Secondary | ICD-10-CM

## 2019-01-13 ENCOUNTER — Encounter: Payer: Self-pay | Admitting: Physician Assistant

## 2019-01-18 ENCOUNTER — Ambulatory Visit: Payer: Federal, State, Local not specified - PPO | Admitting: Physician Assistant

## 2019-01-18 ENCOUNTER — Encounter: Payer: Self-pay | Admitting: Physician Assistant

## 2019-01-18 ENCOUNTER — Other Ambulatory Visit: Payer: Self-pay

## 2019-01-18 ENCOUNTER — Telehealth: Payer: Self-pay | Admitting: Physician Assistant

## 2019-01-18 VITALS — BP 109/71 | HR 77 | Temp 97.9°F | Resp 16 | Ht 68.0 in | Wt 195.1 lb

## 2019-01-18 DIAGNOSIS — M810 Age-related osteoporosis without current pathological fracture: Secondary | ICD-10-CM | POA: Diagnosis not present

## 2019-01-18 DIAGNOSIS — J4521 Mild intermittent asthma with (acute) exacerbation: Secondary | ICD-10-CM

## 2019-01-18 DIAGNOSIS — R059 Cough, unspecified: Secondary | ICD-10-CM

## 2019-01-18 DIAGNOSIS — I10 Essential (primary) hypertension: Secondary | ICD-10-CM

## 2019-01-18 DIAGNOSIS — R05 Cough: Secondary | ICD-10-CM | POA: Diagnosis not present

## 2019-01-18 MED ORDER — CETIRIZINE HCL 10 MG PO TABS: 10.0000 mg | ORAL_TABLET | Freq: Every day | ORAL | 3 refills | Status: AC

## 2019-01-18 MED ORDER — CETIRIZINE HCL 10 MG PO TABS: 10.0000 mg | ORAL_TABLET | Freq: Every day | ORAL | Status: DC

## 2019-01-18 MED ORDER — LISINOPRIL-HYDROCHLOROTHIAZIDE 20-25 MG PO TABS: 1.0000 | ORAL_TABLET | Freq: Every day | ORAL | 10 refills | Status: DC

## 2019-01-18 NOTE — Patient Instructions (Addendum)
Consider getting back on claritin/zyrtec for allergies with flonase.  Cut BP tablet in half. Keep monitoring BP at home. Goal 120/80.  I am going to get information about the exercise and osteoporosis clinic.  Keep taking omeprazole until start feeling better then lets try pepcid only.

## 2019-01-18 NOTE — Telephone Encounter (Signed)
The osteoporosis program is called: [facility] located in [place] off [address] [phone]

## 2019-01-18 NOTE — Progress Notes (Signed)
Subjective:    Patient ID: Angelica Hoover, female    DOB: [birth year], 68 y.o.   MRN: [id number]  HPI  Pt is a 68 yo female with HTN who presents to the clinic for one month follow up.   HTN- doing great. No palpitations, headaches or vision changes. Taking lisinopril/HCTZ daily. No side effects.   Her cough is improving. No SOB or wheezing. She could not tolerate symbicort. She could not afford combivent. Albuterol helps but makes her jittery and anxious. flonase is doing great. On CPAP.  Has CPAP due to snoring. She is taking some sudafed with good relief. She continues to have a dry cough at times. Omeprazole seemed to have helped some.    .. Active Ambulatory Problems    Diagnosis Date Noted  . Chronic idiopathic constipation 03/22/2017  . History of colon polyps 03/22/2017  . Family history of colon cancer 03/22/2017  . Family history of pancreatic cancer 03/22/2017  . Pain of upper abdomen 03/22/2017  . RLS (restless legs syndrome) 08/03/2017  . History of left breast cancer 08/03/2017  . OSA (obstructive sleep apnea) 08/05/2017  . Essential hypertension 08/28/2017  . Primary osteoarthritis of both knees 08/28/2017  . 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 Rosacea 08/28/2017  . Osteoporosis 11/04/2017  . 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 Overweight (BMI 25.0-29.9) 12/22/2018  . Reactive airway disease 01/24/2019   Resolved Ambulatory Problems    Diagnosis Date Noted  . No Resolved Ambulatory Problems   Past Medical History:  Diagnosis Date  . Breast cancer ([place])   . Personal history of chemotherapy       Review of Systems See HPI>     Objective:   Physical Exam Vitals signs reviewed.  Constitutional:      Appearance: Normal appearance.  HENT:     Head: Normocephalic and atraumatic.     Right Ear: Tympanic membrane normal.     Nose: Nose normal. No congestion.     Mouth/Throat:     Mouth: Mucous membranes are moist.     Pharynx: No  posterior oropharyngeal erythema.  Cardiovascular:     Rate and Rhythm: Normal rate and regular rhythm.     Pulses: Normal pulses.  Pulmonary:     Effort: Pulmonary effort is normal.  Neurological:     General: No focal deficit present.     Mental Status: She is alert and oriented to person, place, and time.  Psychiatric:        Mood and Affect: Mood normal.           Assessment & Plan:  Marland KitchenMarland KitchenPatsy was seen today for 1 month follow up.  Diagnoses and all orders for this visit:  Essential hypertension -     lisinopril-hydrochlorothiazide (PRINZIDE,ZESTORETIC) 20-25 MG tablet; Take 1 tablet by mouth daily.  Age related osteoporosis, unspecified pathological fracture presence  Cough -     cetirizine (ZYRTEC) 10 MG tablet; Take 1 tablet (10 mg total) by mouth daily.  Mild intermittent reactive airway disease with acute exacerbation -     cetirizine (ZYRTEC) 10 MG tablet; Take 1 tablet (10 mg total) by mouth daily.  Other orders -     Discontinue: cetirizine (ZYRTEC) 10 MG tablet; Take 1 tablet (10 mg total) by mouth daily.   BP looks great even a little low. Cut BP in half and getting monitoring BP. Goal 120/80.   Pt declines medication for osteoporosis. Discussed osteoporosis clinic that provides exercises that  help increase bone growth.   For cough started omeprazole in about 4 weeks taper off and only do pepcid. Consider treating allergies for cough. flonase with zyrtec claritin.  Follow up as needed.

## 2019-01-19 NOTE — Telephone Encounter (Signed)
Pt advised. States she will call.

## 2019-01-24 ENCOUNTER — Encounter: Payer: Self-pay | Admitting: Physician Assistant

## 2019-01-24 DIAGNOSIS — J45909 Unspecified asthma, uncomplicated: Secondary | ICD-10-CM | POA: Insufficient documentation

## 2019-02-11 ENCOUNTER — Ambulatory Visit: Payer: Federal, State, Local not specified - PPO | Admitting: Physician Assistant

## 2019-02-16 ENCOUNTER — Telehealth: Payer: Self-pay | Admitting: Physician Assistant

## 2019-02-16 NOTE — Telephone Encounter (Signed)
Spoke with patient and she voices understanding. She did not have any further questions.

## 2019-02-16 NOTE — Telephone Encounter (Signed)
As sick and as long as you were sick certainly could have been covid. You were certainly sick before the confirmed cases.   We do not have antibody testing at this time. When we get this it would be an excellent idea to have drawn and know if you have antibodies.   I am glad you are feeling better!

## 2019-02-16 NOTE — Telephone Encounter (Signed)
Patient called and stated that she is not currently having any fever, cough, congestion or sinus pressure. Patient had conjunctivitis and it has cleared up. She thinks she may have had Corona and wants to know if she needs to complete a blood test to see if she has corona antibodies. She had read an article that was talking about a blood test and she wanted to see if she would need to go to a lab and give blood for someone who had the virus. Please advise.

## 2019-03-01 ENCOUNTER — Telehealth: Payer: Self-pay | Admitting: Physician Assistant

## 2019-03-01 NOTE — Telephone Encounter (Signed)
Patient called. She said that she spoke with you about wanting to be put on diet pills, and she was to let you know which one her insurance covered. Her insurance covers neither but she is willing to pay for it. She is leaving it up to you as to which one of the pills would be best for her, she does not want the shot. She would like you to go ahead and call med in to [facility] on [address].  Thanks.

## 2019-03-02 MED ORDER — NALTREXONE-BUPROPION HCL ER 8-90 MG PO TB12: ORAL_TABLET | ORAL | 0 refills | Status: DC

## 2019-03-02 NOTE — Telephone Encounter (Signed)
Pt advised that RX printed and will be faxed on Monday when Luvenia Starch returns to office

## 2019-03-02 NOTE — Telephone Encounter (Signed)
Ok will sent contrave see instructions for how to taper up. She may need to pick up coupon card if we have any.   It printed so I am going to have to come by and sign because instructions too long.

## 2019-04-01 ENCOUNTER — Telehealth: Payer: Self-pay | Admitting: Physician Assistant

## 2019-04-01 DIAGNOSIS — R059 Cough, unspecified: Secondary | ICD-10-CM

## 2019-04-01 DIAGNOSIS — R05 Cough: Secondary | ICD-10-CM

## 2019-04-01 NOTE — Telephone Encounter (Signed)
Left message with information below and for patient to call us back to schedule an appointment or webex. °

## 2019-04-01 NOTE — Telephone Encounter (Signed)
Please call patient and schedule virtual/office visit with Jade for follow up prior to refills. Thanks!  

## 2019-06-16 ENCOUNTER — Other Ambulatory Visit: Payer: Self-pay | Admitting: Physician Assistant

## 2019-06-16 DIAGNOSIS — R0981 Nasal congestion: Secondary | ICD-10-CM

## 2019-06-16 DIAGNOSIS — J069 Acute upper respiratory infection, unspecified: Secondary | ICD-10-CM

## 2019-08-30 ENCOUNTER — Other Ambulatory Visit: Payer: Self-pay

## 2019-08-30 ENCOUNTER — Ambulatory Visit (INDEPENDENT_AMBULATORY_CARE_PROVIDER_SITE_OTHER): Payer: Federal, State, Local not specified - PPO | Admitting: Family Medicine

## 2019-08-30 ENCOUNTER — Telehealth: Payer: Self-pay

## 2019-08-30 VITALS — Temp 98.2°F

## 2019-08-30 DIAGNOSIS — Z23 Encounter for immunization: Secondary | ICD-10-CM

## 2019-08-30 NOTE — Progress Notes (Signed)
Agree with documentation as above.   Catherine Metheney, MD  

## 2019-08-30 NOTE — Progress Notes (Signed)
Patient presents to office for HD flu shot and for her pneumovax 23.   Patient requested that both injections be done in her right deltoid due to HX of breast cancer procedures on left side.  Patient tolerated injections well without any immediate complications.

## 2019-08-30 NOTE — Telephone Encounter (Signed)
Discussed with provider, OK for pneumovax 23

## 2019-08-30 NOTE — Telephone Encounter (Signed)
Patient is on nurse schedule today for flu & pneumonia vaccine. Do not see where provider specified which pneumonia. Per chart, patient has never had pneumonia vaccine.   Please advise on Pneumovax 23 or Prevnar 13.

## 2019-09-02 ENCOUNTER — Other Ambulatory Visit: Payer: Self-pay | Admitting: Neurology

## 2019-09-02 DIAGNOSIS — G4733 Obstructive sleep apnea (adult) (pediatric): Secondary | ICD-10-CM

## 2019-09-02 NOTE — Telephone Encounter (Signed)
Patient left vm stating CPAP machine has broken and needs new RX to go to [facility] in [facility]. She has had machine since 2016. I don't see anything in chart about previous CPAP RX. Okay to write?

## 2019-09-05 MED ORDER — AMBULATORY NON FORMULARY MEDICATION: 0 refills | Status: DC

## 2019-09-05 NOTE — Telephone Encounter (Signed)
Spoke with patient, called [facility] and received prior setting information. RX printed and faxed to [facility] for new machine to [phone] with confirmation received.

## 2019-09-09 ENCOUNTER — Other Ambulatory Visit: Payer: Self-pay

## 2019-09-09 DIAGNOSIS — G4733 Obstructive sleep apnea (adult) (pediatric): Secondary | ICD-10-CM

## 2019-09-09 MED ORDER — AMBULATORY NON FORMULARY MEDICATION: 0 refills | Status: AC

## 2019-09-09 NOTE — Progress Notes (Signed)
Pt needed new order sent because her machine broke. Printed and faxed

## 2019-09-11 ENCOUNTER — Other Ambulatory Visit: Payer: Self-pay | Admitting: Physician Assistant

## 2019-09-11 DIAGNOSIS — J069 Acute upper respiratory infection, unspecified: Secondary | ICD-10-CM

## 2019-09-11 DIAGNOSIS — R0981 Nasal congestion: Secondary | ICD-10-CM

## 2019-09-14 ENCOUNTER — Encounter: Payer: Self-pay | Admitting: Physician Assistant

## 2019-09-14 ENCOUNTER — Other Ambulatory Visit: Payer: Self-pay

## 2019-09-14 ENCOUNTER — Ambulatory Visit (INDEPENDENT_AMBULATORY_CARE_PROVIDER_SITE_OTHER): Payer: Federal, State, Local not specified - PPO | Admitting: Physician Assistant

## 2019-09-14 VITALS — BP 122/69 | HR 87 | Ht 68.0 in | Wt 191.0 lb

## 2019-09-14 DIAGNOSIS — Z23 Encounter for immunization: Secondary | ICD-10-CM

## 2019-09-14 DIAGNOSIS — G4733 Obstructive sleep apnea (adult) (pediatric): Secondary | ICD-10-CM

## 2019-09-14 DIAGNOSIS — R0981 Nasal congestion: Secondary | ICD-10-CM | POA: Diagnosis not present

## 2019-09-14 DIAGNOSIS — Z9989 Dependence on other enabling machines and devices: Secondary | ICD-10-CM

## 2019-09-14 DIAGNOSIS — J069 Acute upper respiratory infection, unspecified: Secondary | ICD-10-CM | POA: Diagnosis not present

## 2019-09-14 DIAGNOSIS — L659 Nonscarring hair loss, unspecified: Secondary | ICD-10-CM

## 2019-09-14 DIAGNOSIS — L700 Acne vulgaris: Secondary | ICD-10-CM | POA: Insufficient documentation

## 2019-09-14 DIAGNOSIS — Z13220 Encounter for screening for lipoid disorders: Secondary | ICD-10-CM

## 2019-09-14 DIAGNOSIS — Z131 Encounter for screening for diabetes mellitus: Secondary | ICD-10-CM

## 2019-09-14 MED ORDER — FLUTICASONE PROPIONATE 50 MCG/ACT NA SUSP: 2.0000 | Freq: Every day | NASAL | 3 refills | Status: AC

## 2019-09-14 MED ORDER — CLINDAMYCIN PHOSPHATE 1 % EX SOLN: Freq: Two times a day (BID) | CUTANEOUS | 2 refills | Status: DC

## 2019-09-14 NOTE — Progress Notes (Signed)
Subjective:    Patient ID: Angelica Hoover, female    DOB: 1951/03/27, 68 y.o.   MRN: [id number]  HPI  Pt is a 68 yo female with OSA on CPAP, HTN, OA, allergies who presents to the clinic for prescription for new CPAP machine.   She had a sleep study in 2016 and been on CPAP since. Pt uses CPAP every night. She responded well to CPAP with better sleeping, anxiety, mood, energy.  She has had one machine and bought a so clean addition that leaked ozone into machine. CPAP needs to be replaced.  She has not had CPAP in 3 weeks and she having trouble sleeping, worse mood, fatigue, dreaming more, not able to stay asleep, snoring.   Pt has started hemp oil and not using linzess for constipation anymore.   She was prescribed some clindamycin solution from dermatology and needs refill for as needed use.   Pt has noticed some hair loss and would like thyroid checked.   .. Active Ambulatory Problems    Diagnosis Date Noted  . Chronic idiopathic constipation 03/22/2017  . History of colon polyps 03/22/2017  . Family history of colon cancer 03/22/2017  . Family history of pancreatic cancer 03/22/2017  . Pain of upper abdomen 03/22/2017  . RLS (restless legs syndrome) 08/03/2017  . History of left breast cancer 08/03/2017  . OSA on CPAP 08/05/2017  . Essential hypertension 08/28/2017  . Primary osteoarthritis of both knees 08/28/2017  . 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 Rosacea 08/28/2017  . Osteoporosis 11/04/2017  . 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 Overweight (BMI 25.0-29.9) 12/22/2018  . Reactive airway disease 01/24/2019  . Acne vulgaris 09/14/2019   Resolved Ambulatory Problems    Diagnosis Date Noted  . No Resolved Ambulatory Problems   Past Medical History:  Diagnosis Date  . Breast cancer ([place])   . Personal history of chemotherapy       Review of Systems  All other systems reviewed and are negative.      Objective:   Physical Exam Vitals  signs reviewed.  Constitutional:      Appearance: Normal appearance.  HENT:     Head: Normocephalic.  Cardiovascular:     Rate and Rhythm: Normal rate and regular rhythm.     Pulses: Normal pulses.  Pulmonary:     Effort: Pulmonary effort is normal.     Breath sounds: Normal breath sounds.  Neurological:     General: No focal deficit present.     Mental Status: She is alert and oriented to person, place, and time.  Psychiatric:        Mood and Affect: Mood normal.           Assessment & Plan:  Marland KitchenMarland KitchenPatsy was seen today for sleep apnea.  Diagnoses and all orders for this visit:  OSA on CPAP  Need for tetanus booster -     Tdap vaccine greater than or equal to 7yo IM  Acute upper respiratory infection -     fluticasone (FLONASE) 50 MCG/ACT nasal spray; Place 2 sprays into both nostrils daily.  Nasal congestion -     fluticasone (FLONASE) 50 MCG/ACT nasal spray; Place 2 sprays into both nostrils daily.  Screening for lipid disorders -     Lipid Panel w/reflex Direct LDL  Screening for diabetes mellitus -     COMPLETE METABOLIC PANEL WITH GFR  Hair loss -     TSH + free T4 -  CBC  Acne vulgaris  Other orders -     clindamycin (CLEOCIN T) 1 % external solution; Apply topically 2 (two) times daily.   Will send in forms for CPAP machine replacement.   Pt needs labs.   Refills given today.

## 2019-09-15 LAB — COMPLETE METABOLIC PANEL WITH GFR
AG Ratio: 1.2 (calc) (ref 1.0–2.5)
ALT: 16 U/L (ref 6–29)
AST: 15 U/L (ref 10–35)
Albumin: 4.3 g/dL (ref 3.6–5.1)
Alkaline phosphatase (APISO): 70 U/L (ref 37–153)
BUN/Creatinine Ratio: 24 (calc) — ABNORMAL HIGH (ref 6–22)
BUN: 26 mg/dL — ABNORMAL HIGH (ref 7–25)
CO2: 26 mmol/L (ref 20–32)
Calcium: 10.4 mg/dL (ref 8.6–10.4)
Chloride: 102 mmol/L (ref 98–110)
Creat: 1.09 mg/dL — ABNORMAL HIGH (ref 0.50–0.99)
GFR, Est African American: 61 mL/min/{1.73_m2} (ref >=60)
GFR, Est Non African American: 52 mL/min/{1.73_m2} — ABNORMAL LOW (ref >=60)
Globulin: 3.6 g/dL (calc) (ref 1.9–3.7)
Glucose, Bld: 103 mg/dL — ABNORMAL HIGH (ref 65–99)
Potassium: 4.3 mmol/L (ref 3.5–5.3)
Sodium: 138 mmol/L (ref 135–146)
Total Bilirubin: 0.4 mg/dL (ref 0.2–1.2)
Total Protein: 7.9 g/dL (ref 6.1–8.1)

## 2019-09-15 LAB — LIPID PANEL W/REFLEX DIRECT LDL
Cholesterol: 229 mg/dL — ABNORMAL HIGH (ref <200)
HDL: 59 mg/dL (ref >=50)
LDL Cholesterol (Calc): 143 mg/dL (calc) — ABNORMAL HIGH
Non-HDL Cholesterol (Calc): 170 mg/dL (calc) — ABNORMAL HIGH (ref <130)
Total CHOL/HDL Ratio: 3.9 (calc) (ref <5.0)
Triglycerides: 142 mg/dL (ref <150)

## 2019-09-15 LAB — CBC
HCT: 39.9 % (ref 35.0–45.0)
Hemoglobin: 13.5 g/dL (ref 11.7–15.5)
MCH: 30.1 pg (ref 27.0–33.0)
MCHC: 33.8 g/dL (ref 32.0–36.0)
MCV: 88.9 fL (ref 80.0–100.0)
MPV: 10.7 fL (ref 7.5–12.5)
Platelets: 360 10*3/uL (ref 140–400)
RBC: 4.49 10*6/uL (ref 3.80–5.10)
RDW: 12.3 % (ref 11.0–15.0)
WBC: 10.2 10*3/uL (ref 3.8–10.8)

## 2019-09-15 LAB — TSH+FREE T4: TSH W/REFLEX TO FT4: 1.47 mIU/L (ref 0.40–4.50)

## 2019-09-16 ENCOUNTER — Other Ambulatory Visit: Payer: Self-pay

## 2019-09-16 DIAGNOSIS — I10 Essential (primary) hypertension: Secondary | ICD-10-CM

## 2019-09-16 NOTE — Progress Notes (Signed)
Call pt: thyroid looks great. No anemia. LDL is elevated. HDL went down some. 10 year CV risk is 8.3 percent. Anything over 7.5 percent we recommended statin for cholesterol control.  Your kidney shows some decrease in filtration. Not terrible. Can be hydration or OTC NSAID use or perhaps even the supplements. Lets recheck bmp in 4 weeks.

## 2019-12-14 ENCOUNTER — Other Ambulatory Visit: Payer: Self-pay | Admitting: Physician Assistant

## 2020-01-12 ENCOUNTER — Other Ambulatory Visit: Payer: Self-pay | Admitting: Physician Assistant

## 2020-03-30 ENCOUNTER — Other Ambulatory Visit: Payer: Self-pay

## 2020-03-30 ENCOUNTER — Ambulatory Visit (INDEPENDENT_AMBULATORY_CARE_PROVIDER_SITE_OTHER): Payer: Federal, State, Local not specified - PPO

## 2020-03-30 ENCOUNTER — Encounter: Payer: Self-pay | Admitting: Medical-Surgical

## 2020-03-30 ENCOUNTER — Ambulatory Visit: Payer: Federal, State, Local not specified - PPO | Admitting: Medical-Surgical

## 2020-03-30 VITALS — BP 129/73 | HR 73 | Temp 97.6°F | Ht 68.0 in | Wt 191.8 lb

## 2020-03-30 DIAGNOSIS — M79672 Pain in left foot: Secondary | ICD-10-CM | POA: Diagnosis not present

## 2020-03-30 DIAGNOSIS — M109 Gout, unspecified: Secondary | ICD-10-CM | POA: Diagnosis not present

## 2020-03-30 MED ORDER — PREDNISONE 50 MG PO TABS: 50.0000 mg | ORAL_TABLET | Freq: Every day | ORAL | 0 refills | Status: DC

## 2020-03-30 NOTE — Progress Notes (Signed)
Subjective:    CC: left foot pain  HPI: Very pleasant 69 year old female presenting today with complaints of left foot/great toe pain that started approximately 1 month ago but has worsened over the last couple of days.  Notes that she has swelling in the forefoot at the MTP joint of the great toe and also has pain in the great toe itself.  She has tried ice, heat, ibuprofen, and Tylenol with no relief.  Pain now severe and interfering with her ability to wear shoes comfortably.  History of osteoarthritis affecting various joints including bilateral knees and thumbs.  Recent dietary intake not indicative of gout.  Onset of foot pain and discomfort gradual.  I reviewed the past medical history, family history, social history, surgical history, and allergies today and no changes were needed.  Please see the problem list section below in epic for further details.  Past Medical History: Past Medical History:  Diagnosis Date  . 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 Breast cancer ([place])    Left Breast   . Personal history of chemotherapy    Past Surgical History: Past Surgical History:  Procedure Laterality Date  . AUGMENTATION MAMMAPLASTY    . MASTECTOMY Left    1999   Social History: Social History   Socioeconomic History  . Marital status: Married    Spouse name: Not on file  . Number of children: Not on file  . Years of education: Not on file  . Highest education level: Not on file  Occupational History  . Not on file  Tobacco Use  . Smoking status: Former Research scientist (life sciences)  . Smokeless tobacco: Never Used  Substance and Sexual Activity  . Alcohol use: Yes  . Drug use: No  . Sexual activity: Not on file  Other Topics Concern  . Not on file  Social History Narrative  . Not on file   Social Determinants of Health   Financial Resource Strain:   . Difficulty of Paying Living Expenses:   Food Insecurity:   . Worried About Charity fundraiser in the Last Year:   . Arboriculturist in  the Last Year:   Transportation Needs:   . Film/video editor (Medical):   Marland Kitchen Lack of Transportation (Non-Medical):   Physical Activity:   . Days of Exercise per Week:   . Minutes of Exercise per Session:   Stress:   . Feeling of Stress :   Social Connections:   . Frequency of Communication with Friends and Family:   . Frequency of Social Gatherings with Friends and Family:   . Attends Religious Services:   . Active Member of Clubs or Organizations:   . Attends Archivist Meetings:   Marland Kitchen Marital Status:    Family History: History reviewed. No pertinent family history. Allergies: Allergies  Allergen Reactions  . Symbicort [Budesonide-Formoterol Fumarate] Other (See Comments)    Tingling in legs and feet. Also weakness.   . Albuterol Sulfate   . Aspirin   . Codeine   . Cortisone     Pending as of 02/15/15  . Gabapentin     Weight gain.   Christie Nottingham [Pramipexole Dihydrochloride]     Constipated/insomnia/chest pain  . Penicillins    Medications: See med rec.  Review of Systems: See HPI for pertinent positives and negatives.  Objective:    General: Well Developed, well nourished, and in no acute distress.  Neuro: Alert and oriented x3.  HEENT: Normocephalic, atraumatic.  Skin: Warm and  dry. Cardiac: Regular rate and rhythm, no murmurs rubs or gallops, no lower extremity edema.  Respiratory: Clear to auscultation bilaterally. Not using accessory muscles, speaking in full sentences. Left foot:   Impression and Recommendations:    1. Left foot pain Gradual onset consistent with osteoarthritis but redness, swelling, and tenderness of the left first MTP joint and recent worsening over the past couple of days suspicious for possible gout.  Will check uric acid.  Getting x-rays of the left foot.  We will do a 5-day burst of prednisone to see if this helps.  Continue conservative measures but take care when taking ibuprofen as GI upset and irritation can occur while on  prednisone. - predniSONE (DELTASONE) 50 MG tablet; Take 1 tablet (50 mg total) by mouth daily.  Dispense: 5 tablet; Refill: 0 - DG Foot Complete Left; Future - Uric acid  Return if symptoms worsen or fail to improve. ___________________________________________ Clearnce Sorrel, DNP, APRN, FNP-BC [facility] [place]

## 2020-03-31 LAB — URIC ACID: Uric Acid, Serum: 9.5 mg/dL — ABNORMAL HIGH (ref 2.5–7.0)

## 2020-03-31 MED ORDER — ALLOPURINOL 100 MG PO TABS: 100.0000 mg | ORAL_TABLET | Freq: Every day | ORAL | 1 refills | Status: DC

## 2020-03-31 NOTE — Addendum Note (Signed)
Addended bySamuel Bouche on: 03/31/2020 02:37 PM   Modules accepted: Orders

## 2020-04-14 ENCOUNTER — Other Ambulatory Visit: Payer: Self-pay | Admitting: Physician Assistant

## 2020-04-14 DIAGNOSIS — I10 Essential (primary) hypertension: Secondary | ICD-10-CM

## 2020-04-23 ENCOUNTER — Other Ambulatory Visit: Payer: Self-pay | Admitting: Medical-Surgical

## 2020-04-23 DIAGNOSIS — M109 Gout, unspecified: Secondary | ICD-10-CM

## 2020-04-23 NOTE — Telephone Encounter (Signed)
Routing to PCP

## 2020-05-01 ENCOUNTER — Encounter: Payer: Self-pay | Admitting: Physician Assistant

## 2020-05-01 DIAGNOSIS — Z20822 Contact with and (suspected) exposure to covid-19: Secondary | ICD-10-CM

## 2020-05-01 NOTE — Telephone Encounter (Signed)
Yes fine

## 2020-05-04 ENCOUNTER — Other Ambulatory Visit: Payer: Self-pay | Admitting: Neurology

## 2020-05-04 DIAGNOSIS — I10 Essential (primary) hypertension: Secondary | ICD-10-CM

## 2020-05-04 DIAGNOSIS — M109 Gout, unspecified: Secondary | ICD-10-CM

## 2020-05-08 LAB — BASIC METABOLIC PANEL WITH GFR
BUN/Creatinine Ratio: 22 (calc) (ref 6–22)
BUN: 27 mg/dL — ABNORMAL HIGH (ref 7–25)
CO2: 28 mmol/L (ref 20–32)
Calcium: 10.3 mg/dL (ref 8.6–10.4)
Chloride: 100 mmol/L (ref 98–110)
Creat: 1.25 mg/dL — ABNORMAL HIGH (ref 0.50–0.99)
GFR, Est African American: 52 mL/min/{1.73_m2} — ABNORMAL LOW (ref >=60)
GFR, Est Non African American: 44 mL/min/{1.73_m2} — ABNORMAL LOW (ref >=60)
Glucose, Bld: 93 mg/dL (ref 65–99)
Potassium: 4.6 mmol/L (ref 3.5–5.3)
Sodium: 138 mmol/L (ref 135–146)

## 2020-05-08 LAB — URIC ACID: Uric Acid, Serum: 7.5 mg/dL — ABNORMAL HIGH (ref 2.5–7.0)

## 2020-05-09 ENCOUNTER — Other Ambulatory Visit: Payer: Self-pay | Admitting: Physician Assistant

## 2020-05-09 DIAGNOSIS — N183 Chronic kidney disease, stage 3 unspecified: Secondary | ICD-10-CM | POA: Insufficient documentation

## 2020-05-09 DIAGNOSIS — N1832 Chronic kidney disease, stage 3b: Secondary | ICD-10-CM

## 2020-05-09 LAB — SARS COV-2 SEROLOGY(COVID-19)AB(IGG,IGM),IMMUNOASSAY
SARS CoV-2 AB IgG: NEGATIVE
SARS CoV-2 IgM: NEGATIVE

## 2020-05-09 NOTE — Progress Notes (Signed)
Angelica Hoover,   Renal function has declined some since 7 months ago. GFR 44. Make sure not taking any anti-inflammatories. Make sure keeping tight control of blood pressure under 130/80. Stay hydrated. Recheck in 3 months.

## 2020-05-09 NOTE — Telephone Encounter (Signed)
No antibodies to covid.

## 2020-05-09 NOTE — Progress Notes (Signed)
Zamia,   Uric Acid has gone down but still not to goal of under 6. I would like to increase allopurinol to 200mg  a day and recheck levels in 6 weeks.

## 2020-05-15 ENCOUNTER — Encounter: Payer: Self-pay | Admitting: Physician Assistant

## 2020-05-15 DIAGNOSIS — M109 Gout, unspecified: Secondary | ICD-10-CM

## 2020-05-15 MED ORDER — ALLOPURINOL 100 MG PO TABS: 200.0000 mg | ORAL_TABLET | Freq: Every day | ORAL | 1 refills | Status: DC

## 2020-05-16 ENCOUNTER — Encounter: Payer: Self-pay | Admitting: Physician Assistant

## 2020-05-16 ENCOUNTER — Other Ambulatory Visit: Payer: Self-pay | Admitting: Physician Assistant

## 2020-05-16 DIAGNOSIS — M109 Gout, unspecified: Secondary | ICD-10-CM

## 2020-05-16 MED ORDER — PREDNISONE 50 MG PO TABS
ORAL_TABLET | ORAL | 0 refills | Status: DC
Start: 2020-05-16 — End: 2020-08-27

## 2020-05-16 MED ORDER — COLCHICINE 0.6 MG PO TABS
0.6000 mg | ORAL_TABLET | Freq: Every day | ORAL | 0 refills | Status: DC
Start: 2020-05-16 — End: 2020-08-27

## 2020-05-16 NOTE — Telephone Encounter (Signed)
RX pended for Colchicine

## 2020-05-16 NOTE — Addendum Note (Signed)
Addended by: Towana Badger on: 05/16/2020 01:43 PM   Modules accepted: Orders

## 2020-06-07 ENCOUNTER — Other Ambulatory Visit: Payer: Self-pay | Admitting: Physician Assistant

## 2020-06-07 DIAGNOSIS — M109 Gout, unspecified: Secondary | ICD-10-CM

## 2020-06-22 LAB — BASIC METABOLIC PANEL
BUN/Creatinine Ratio: 22 (calc) (ref 6–22)
BUN: 25 mg/dL (ref 7–25)
CO2: 28 mmol/L (ref 20–32)
Calcium: 10.5 mg/dL — ABNORMAL HIGH (ref 8.6–10.4)
Chloride: 104 mmol/L (ref 98–110)
Creat: 1.13 mg/dL — ABNORMAL HIGH (ref 0.50–0.99)
Glucose, Bld: 92 mg/dL (ref 65–99)
Potassium: 4.1 mmol/L (ref 3.5–5.3)
Sodium: 139 mmol/L (ref 135–146)

## 2020-06-22 LAB — URIC ACID: Uric Acid, Serum: 5.7 mg/dL (ref 2.5–7.0)

## 2020-06-25 ENCOUNTER — Other Ambulatory Visit: Payer: Self-pay | Admitting: Physician Assistant

## 2020-06-25 DIAGNOSIS — M109 Gout, unspecified: Secondary | ICD-10-CM

## 2020-06-25 MED ORDER — ALLOPURINOL 100 MG PO TABS: 200.0000 mg | ORAL_TABLET | Freq: Every day | ORAL | 3 refills | Status: DC

## 2020-06-25 NOTE — Progress Notes (Signed)
Angelica Hoover,   Uric acid to goal. At this level you should not have any more flares if you stay on preventative.  Your calcium is up a little. Make sure taking vitamin D daily. We should recheck Calcium/PTH in next 3-6 months to make sure not trending up.

## 2020-06-28 ENCOUNTER — Other Ambulatory Visit: Payer: Self-pay | Admitting: Neurology

## 2020-07-16 ENCOUNTER — Other Ambulatory Visit: Payer: Self-pay | Admitting: Physician Assistant

## 2020-07-16 DIAGNOSIS — I10 Essential (primary) hypertension: Secondary | ICD-10-CM

## 2020-08-06 ENCOUNTER — Other Ambulatory Visit: Payer: Self-pay | Admitting: Physician Assistant

## 2020-08-10 ENCOUNTER — Other Ambulatory Visit: Payer: Self-pay | Admitting: Physician Assistant

## 2020-08-10 DIAGNOSIS — I10 Essential (primary) hypertension: Secondary | ICD-10-CM

## 2020-08-18 ENCOUNTER — Other Ambulatory Visit: Payer: Self-pay | Admitting: Physician Assistant

## 2020-08-18 DIAGNOSIS — I10 Essential (primary) hypertension: Secondary | ICD-10-CM

## 2020-08-27 ENCOUNTER — Other Ambulatory Visit: Payer: Self-pay

## 2020-08-27 ENCOUNTER — Ambulatory Visit (INDEPENDENT_AMBULATORY_CARE_PROVIDER_SITE_OTHER): Payer: Federal, State, Local not specified - PPO | Admitting: Physician Assistant

## 2020-08-27 VITALS — BP 111/58 | HR 80 | Ht 68.0 in | Wt 190.0 lb

## 2020-08-27 DIAGNOSIS — Z23 Encounter for immunization: Secondary | ICD-10-CM | POA: Diagnosis not present

## 2020-08-27 DIAGNOSIS — M109 Gout, unspecified: Secondary | ICD-10-CM | POA: Diagnosis not present

## 2020-08-27 DIAGNOSIS — Z7185 Encounter for immunization safety counseling: Secondary | ICD-10-CM

## 2020-08-27 DIAGNOSIS — I10 Essential (primary) hypertension: Secondary | ICD-10-CM

## 2020-08-27 DIAGNOSIS — M21612 Bunion of left foot: Secondary | ICD-10-CM | POA: Insufficient documentation

## 2020-08-27 DIAGNOSIS — M79644 Pain in right finger(s): Secondary | ICD-10-CM | POA: Diagnosis not present

## 2020-08-27 DIAGNOSIS — G8929 Other chronic pain: Secondary | ICD-10-CM | POA: Insufficient documentation

## 2020-08-27 DIAGNOSIS — N1831 Chronic kidney disease, stage 3a: Secondary | ICD-10-CM

## 2020-08-27 MED ORDER — LISINOPRIL-HYDROCHLOROTHIAZIDE 20-25 MG PO TABS: 1.0000 | ORAL_TABLET | Freq: Every day | ORAL | 3 refills | Status: DC

## 2020-08-27 MED ORDER — DICLOFENAC SODIUM 1 % EX GEL: 4.0000 g | Freq: Four times a day (QID) | CUTANEOUS | 11 refills | Status: DC

## 2020-08-27 NOTE — Progress Notes (Signed)
Wants to know if any connection between gout and kidney disease Having nerve/burning pain in toes Gout pain gone since colchicine addition (did for 3 months) Wants PNA shot (had 23 last year, but 08/29/20 and not a year til two days from now) Joint issues - pain in right thumb/middle finger and left pinky Also in knees and ankles Hard to open jars Needs refill on lisinopril HCTZ

## 2020-08-27 NOTE — Progress Notes (Signed)
Subjective:    Patient ID: Angelica Hoover, female    DOB: 1951/04/27, 69 y.o.   MRN: [id number]  HPI  Pt is a 69 yo female with HTN, gout, CKD who presents to the clinic for follow up.   She has some questions:  She would like to discuss correlation between gout and kidney disease.   She is having some burning and tingling in great toes. Gout resolved on allopurinol.   She is having some aching in joints in hands, knees, ankles. Harder to open jars.   HTN- needs refill. No CP, palpitations, headaches, vision changes.   OSA- doing well on CPAP.     .. Active Ambulatory Problems    Diagnosis Date Noted  . Chronic idiopathic constipation 03/22/2017  . History of colon polyps 03/22/2017  . Family history of colon cancer 03/22/2017  . Family history of pancreatic cancer 03/22/2017  . Pain of upper abdomen 03/22/2017  . RLS (restless legs syndrome) 08/03/2017  . History of left breast cancer 08/03/2017  . OSA on CPAP 08/05/2017  . Essential hypertension 08/28/2017  . Primary osteoarthritis of both knees 08/28/2017  . 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 Rosacea 08/28/2017  . Osteoporosis 11/04/2017  . 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 Overweight (BMI 25.0-29.9) 12/22/2018  . Reactive airway disease 01/24/2019  . Acne vulgaris 09/14/2019  . CKD (chronic kidney disease) stage 3, GFR 30-59 ml/min (HCC) 05/09/2020   Resolved Ambulatory Problems    Diagnosis Date Noted  . No Resolved Ambulatory Problems   Past Medical History:  Diagnosis Date  . Breast cancer ([place])   . Personal history of chemotherapy        Review of Systems  All other systems reviewed and are negative.      Objective:   Physical Exam Vitals reviewed.  Constitutional:      Appearance: Normal appearance.  Cardiovascular:     Rate and Rhythm: Normal rate and regular rhythm.     Pulses: Normal pulses.     Heart sounds: Normal heart sounds.  Pulmonary:     Effort:  Pulmonary effort is normal.     Breath sounds: Normal breath sounds.  Musculoskeletal:     Comments: Left great bunion.   Neurological:     General: No focal deficit present.     Mental Status: She is alert and oriented to person, place, and time.  Psychiatric:        Mood and Affect: Mood normal.           Assessment & Plan:  Marland KitchenMarland KitchenPatsy was seen today for hypertension.  Diagnoses and all orders for this visit:  Essential hypertension -     lisinopril-hydrochlorothiazide (ZESTORETIC) 20-25 MG tablet; Take 1 tablet by mouth daily. -     BASIC METABOLIC PANEL WITH GFR  Stage 3a chronic kidney disease (HCC) -     BASIC METABOLIC PANEL WITH GFR  Serum calcium elevated -     PTH, Intact and Calcium  Chronic pain of right thumb -     DG Finger Thumb Right  Flu vaccine need -     Flu Vaccine QUAD 36+ mos IM  Need for pneumococcal vaccine -     Pneumococcal conjugate vaccine 13-valent IM  Gout involving toe of left foot, unspecified cause, unspecified chronicity  Bunion, left  Vaccine counseling   Gout appears to be controlled. Continue on allopurinol. Discussed gout and CKD.  Reordered CMP.   Pt appears to  have some arthritic pain. On tumeric. Xray ordered for great thumb, right.  Given diclofenac gel. Not candidate for NSAID. If one particular joint bothering patient could consider infection with sports medicine.   Pt does have bunion. I think it could be causing some pain as well. Will send to podiatry.   pneumonia vaccine given today.  Discussed covid vaccine with risk vs benefits. Encouraged patient to get vaccine.

## 2020-08-28 ENCOUNTER — Ambulatory Visit (INDEPENDENT_AMBULATORY_CARE_PROVIDER_SITE_OTHER): Payer: Federal, State, Local not specified - PPO

## 2020-08-28 DIAGNOSIS — G8929 Other chronic pain: Secondary | ICD-10-CM | POA: Diagnosis not present

## 2020-08-28 DIAGNOSIS — M79644 Pain in right finger(s): Secondary | ICD-10-CM | POA: Diagnosis not present

## 2020-08-29 ENCOUNTER — Encounter: Payer: Self-pay | Admitting: Physician Assistant

## 2020-08-29 LAB — BASIC METABOLIC PANEL WITH GFR
BUN/Creatinine Ratio: 22 (calc) (ref 6–22)
BUN: 23 mg/dL (ref 7–25)
CO2: 31 mmol/L (ref 20–32)
Calcium: 10.5 mg/dL — ABNORMAL HIGH (ref 8.6–10.4)
Chloride: 100 mmol/L (ref 98–110)
Creat: 1.06 mg/dL — ABNORMAL HIGH (ref 0.50–0.99)
GFR, Est African American: 62 mL/min/{1.73_m2} (ref >=60)
GFR, Est Non African American: 54 mL/min/{1.73_m2} — ABNORMAL LOW (ref >=60)
Glucose, Bld: 98 mg/dL (ref 65–99)
Potassium: 4.1 mmol/L (ref 3.5–5.3)
Sodium: 138 mmol/L (ref 135–146)

## 2020-08-29 LAB — PTH, INTACT AND CALCIUM
Calcium: 10.5 mg/dL — ABNORMAL HIGH (ref 8.6–10.4)
PTH: 30 pg/mL (ref 14–64)

## 2020-08-29 NOTE — Progress Notes (Signed)
Angelica Hoover,   GFR improved quite a bit to 54. Great news. Waiting on PTH.

## 2020-08-29 NOTE — Progress Notes (Signed)
Arthritis noted in joints of hand/fingers. Appears as suspected like osteoarthritis. Treatment plan stays the same.

## 2020-08-30 NOTE — Progress Notes (Signed)
See,   Kidney function improved!!! GFR 54 Calcium stable but elevated. Parathyroid is normal. 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 We will monitor. Make sure taking vitamin D daily to help get calcium in bones. As long as calcium is stable no intervention is needed.

## 2020-09-03 ENCOUNTER — Encounter: Payer: Self-pay | Admitting: Physician Assistant

## 2020-09-03 MED ORDER — COLCHICINE 0.6 MG PO TABS: 0.6000 mg | ORAL_TABLET | Freq: Every day | ORAL | 1 refills | Status: DC | PRN

## 2020-09-04 ENCOUNTER — Encounter: Payer: Self-pay | Admitting: Physician Assistant

## 2020-09-21 ENCOUNTER — Encounter: Payer: Federal, State, Local not specified - PPO | Admitting: Podiatry

## 2020-09-22 NOTE — Progress Notes (Signed)
Pt cancelled

## 2020-09-25 ENCOUNTER — Other Ambulatory Visit: Payer: Self-pay | Admitting: Family Medicine

## 2020-09-25 ENCOUNTER — Other Ambulatory Visit: Payer: Self-pay | Admitting: Physician Assistant

## 2020-11-10 IMAGING — DX DG FINGER THUMB 2+V*R*
3 series · 3 of 3 positions shown · non-contrast
Comparison: None.

CLINICAL DATA: Chronic right thumb pain worsening over the last few
months. No known injury.

EXAM:
RIGHT THUMB 2+V

[finger ap]
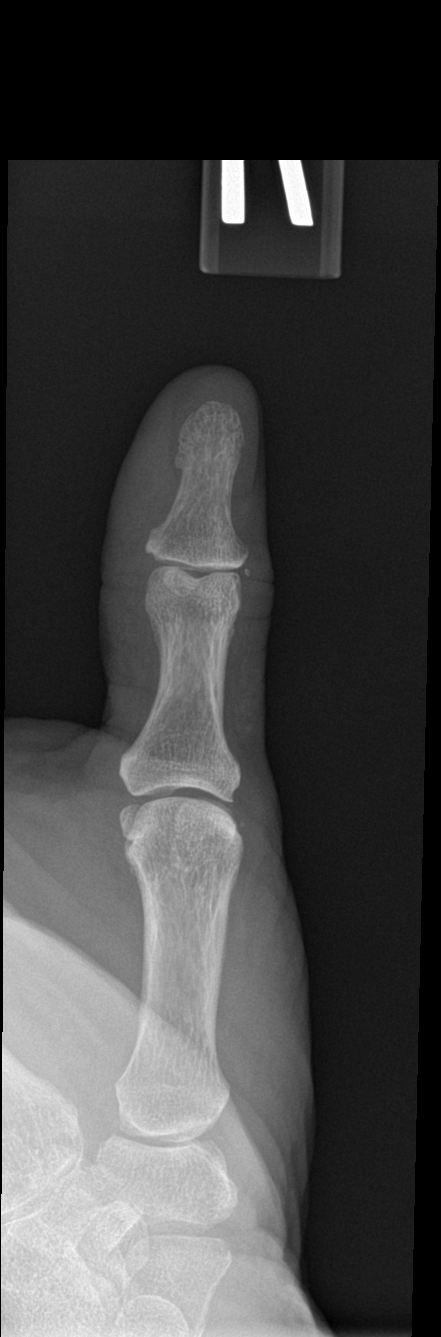

[finger obl]
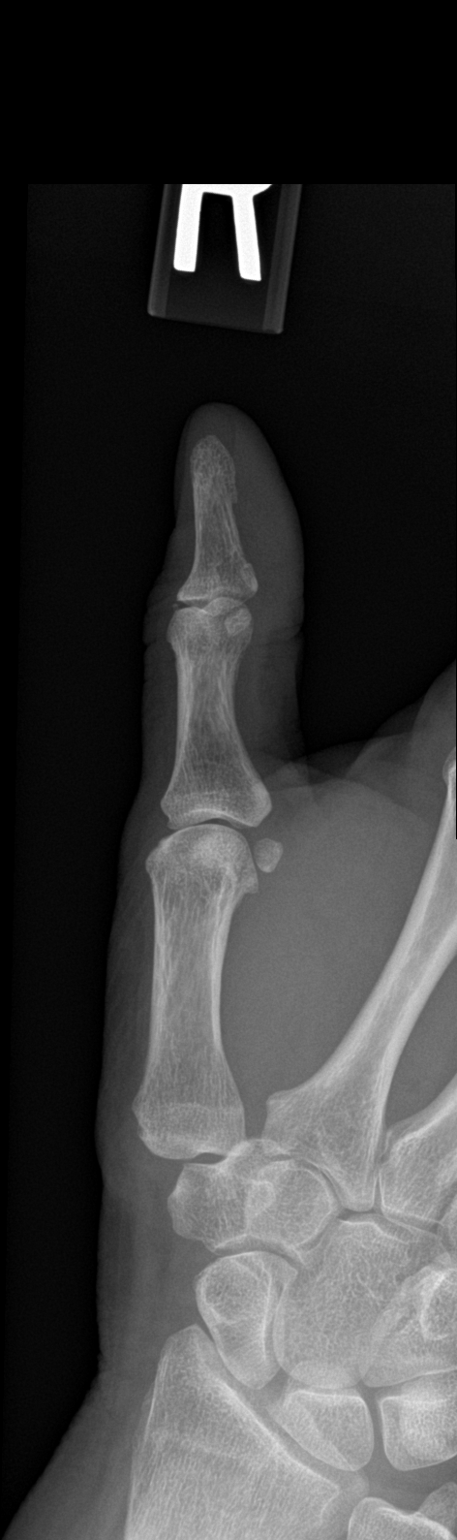

[finger lat]
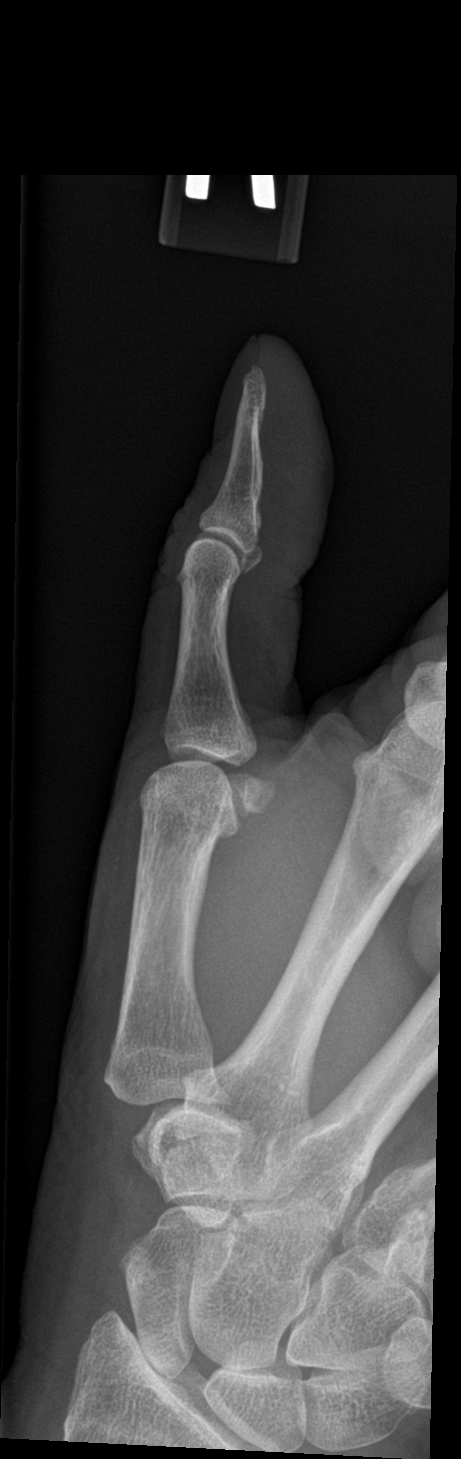

[3 of 3 positions shown; findings below may reference images not displayed]

FINDINGS: The mineralization and alignment are normal. There is no evidence of
acute fracture or dislocation. Mild degenerative changes at the
metacarpal phalangeal and interphalangeal joints. No erosive changes
or focal soft tissue abnormalities.
IMPRESSION: Mild degenerative changes. No acute osseous findings.

## 2021-06-26 ENCOUNTER — Other Ambulatory Visit: Payer: Self-pay | Admitting: Physician Assistant

## 2021-06-26 DIAGNOSIS — M109 Gout, unspecified: Secondary | ICD-10-CM

## 2021-07-15 ENCOUNTER — Other Ambulatory Visit: Payer: Self-pay | Admitting: Physician Assistant

## 2021-07-15 DIAGNOSIS — I10 Essential (primary) hypertension: Secondary | ICD-10-CM

## 2021-08-19 ENCOUNTER — Other Ambulatory Visit: Payer: Self-pay | Admitting: Neurology

## 2021-08-19 MED ORDER — DICLOFENAC SODIUM 1 % EX GEL: 4.0000 g | Freq: Four times a day (QID) | CUTANEOUS | 1 refills | Status: DC

## 2021-08-22 ENCOUNTER — Ambulatory Visit (INDEPENDENT_AMBULATORY_CARE_PROVIDER_SITE_OTHER): Payer: Federal, State, Local not specified - PPO | Admitting: Physician Assistant

## 2021-08-22 ENCOUNTER — Other Ambulatory Visit: Payer: Self-pay

## 2021-08-22 ENCOUNTER — Encounter: Payer: Self-pay | Admitting: Physician Assistant

## 2021-08-22 VITALS — BP 145/77 | HR 83 | Ht 68.0 in | Wt 189.0 lb

## 2021-08-22 DIAGNOSIS — M79644 Pain in right finger(s): Secondary | ICD-10-CM

## 2021-08-22 DIAGNOSIS — R030 Elevated blood-pressure reading, without diagnosis of hypertension: Secondary | ICD-10-CM

## 2021-08-22 DIAGNOSIS — Z23 Encounter for immunization: Secondary | ICD-10-CM

## 2021-08-22 DIAGNOSIS — Z1329 Encounter for screening for other suspected endocrine disorder: Secondary | ICD-10-CM | POA: Diagnosis not present

## 2021-08-22 DIAGNOSIS — G4733 Obstructive sleep apnea (adult) (pediatric): Secondary | ICD-10-CM

## 2021-08-22 DIAGNOSIS — G8929 Other chronic pain: Secondary | ICD-10-CM

## 2021-08-22 DIAGNOSIS — Z79899 Other long term (current) drug therapy: Secondary | ICD-10-CM

## 2021-08-22 DIAGNOSIS — Z13220 Encounter for screening for lipoid disorders: Secondary | ICD-10-CM

## 2021-08-22 DIAGNOSIS — E781 Pure hyperglyceridemia: Secondary | ICD-10-CM

## 2021-08-22 DIAGNOSIS — M5030 Other cervical disc degeneration, unspecified cervical region: Secondary | ICD-10-CM

## 2021-08-22 DIAGNOSIS — M109 Gout, unspecified: Secondary | ICD-10-CM

## 2021-08-22 DIAGNOSIS — I10 Essential (primary) hypertension: Secondary | ICD-10-CM

## 2021-08-22 DIAGNOSIS — K5904 Chronic idiopathic constipation: Secondary | ICD-10-CM

## 2021-08-22 DIAGNOSIS — M542 Cervicalgia: Secondary | ICD-10-CM

## 2021-08-22 DIAGNOSIS — Z1231 Encounter for screening mammogram for malignant neoplasm of breast: Secondary | ICD-10-CM

## 2021-08-22 DIAGNOSIS — E785 Hyperlipidemia, unspecified: Secondary | ICD-10-CM

## 2021-08-22 DIAGNOSIS — M810 Age-related osteoporosis without current pathological fracture: Secondary | ICD-10-CM

## 2021-08-22 DIAGNOSIS — N1831 Chronic kidney disease, stage 3a: Secondary | ICD-10-CM

## 2021-08-22 DIAGNOSIS — Z9989 Dependence on other enabling machines and devices: Secondary | ICD-10-CM

## 2021-08-22 MED ORDER — LISINOPRIL-HYDROCHLOROTHIAZIDE 20-25 MG PO TABS: 1.0000 | ORAL_TABLET | Freq: Every day | ORAL | 1 refills | Status: DC

## 2021-08-22 MED ORDER — DICLOFENAC SODIUM 1 % EX GEL: 4.0000 g | Freq: Four times a day (QID) | CUTANEOUS | 1 refills | Status: DC

## 2021-08-22 MED ORDER — ALLOPURINOL 100 MG PO TABS: 200.0000 mg | ORAL_TABLET | Freq: Every day | ORAL | 3 refills | Status: DC

## 2021-08-22 MED ORDER — CYCLOBENZAPRINE HCL 5 MG PO TABS: 5.0000 mg | ORAL_TABLET | Freq: Three times a day (TID) | ORAL | 1 refills | Status: DC | PRN

## 2021-08-22 NOTE — Patient Instructions (Addendum)
Osteoporosis Osteoporosis happens when the bones become thin and less dense than normal. Osteoporosis makes bones more brittle and fragile and more likely to break (fracture). Over time, osteoporosis can cause your bones to become so weak that they fracture after a minor fall. Bones in the hip, wrist, and spine are most likely to fracture due to osteoporosis. What are the causes? The exact cause of this condition is not known. What increases the risk? You are more likely to develop this condition if you: Have family members with this condition. Have poor nutrition. Use the following: Steroid medicines, such as prednisone. Anti-seizure medicines. Nicotine or tobacco, such as cigarettes, e-cigarettes, and chewing tobacco. Are female. Are age 69 or older. Are not physically active (are sedentary). Are of European or Asian descent. Have a small body frame. What are the signs or symptoms? A fracture might be the first sign of osteoporosis, especially if the fracture results from a fall or injury that usually would not cause a bone to break. Other signs and symptoms include: Pain in the neck or low back. Stooped posture. Loss of height. How is this diagnosed? This condition may be diagnosed based on: Your medical history. A physical exam. A bone mineral density test, also called a DXA or DEXA test (dual-energy X-ray absorptiometry test). This test uses X-rays to measure the amount of minerals in your bones. How is this treated? This condition may be treated by: Making lifestyle changes, such as: Including foods with more calcium and vitamin D in your diet. Doing weight-bearing and muscle-strengthening exercises. Stopping tobacco use. Limiting alcohol intake. Taking medicine to slow the process of bone loss or to increase bone density. Taking daily supplements of calcium and vitamin D. Taking hormone replacement medicines, such as estrogen for women and testosterone for men. Monitoring  your levels of calcium and vitamin D. The goal of treatment is to strengthen your bones and lower your risk for a fracture. Follow these instructions at home: Eating and drinking Include calcium and vitamin D in your diet. Calcium is important for bone health, and vitamin D helps your body absorb calcium. Good sources of calcium and vitamin D include: Certain fatty fish, such as salmon and tuna. Products that have calcium and vitamin D added to them (are fortified), such as fortified cereals. Egg yolks. Cheese. Liver.  Activity Do exercises as told by your health care provider. Ask your health care provider what exercises and activities are safe for you. You should do: Exercises that make you work against gravity (weight-bearing exercises), such as tai chi, yoga, or walking. Exercises to strengthen muscles, such as lifting weights. Lifestyle Do not drink alcohol if: Your health care provider tells you not to drink. You are pregnant, may be pregnant, or are planning to become pregnant. If you drink alcohol: Limit how much you use to: 0-1 drink a day for women. 0-2 drinks a day for men. Know how much alcohol is in your drink. In the [place] one drink equals one 12 oz bottle of beer (355 mL), one 5 oz glass of wine (148 mL), or one 1 oz glass of hard liquor (44 mL). Do not use any products that contain nicotine or tobacco, such as cigarettes, e-cigarettes, and chewing tobacco. If you need help quitting, ask your health care provider. Preventing falls Use devices to help you move around (mobility aids) as needed, such as canes, walkers, scooters, or crutches. Keep rooms well-lit and clutter-free. Remove tripping hazards from walkways, including cords and throw  rugs. Install grab bars in bathrooms and safety rails on stairs. Use rubber mats in the bathroom and other areas that are often wet or slippery. Wear closed-toe shoes that fit well and support your feet. Wear shoes that have rubber  soles or low heels. Review your medicines with your health care provider. Some medicines can cause dizziness or changes in blood pressure, which can increase your risk of falling. General instructions Take over-the-counter and prescription medicines only as told by your health care provider. Keep all follow-up visits. This is important. Contact a health care provider if: You have never been screened for osteoporosis and you are: A woman who is age 69 or older. A man who is age 69 or older. Get help right away if: You fall or injure yourself. Summary Osteoporosis is thinning and loss of density in your bones. This makes bones more brittle and fragile and more likely to break (fracture),even with minor falls. The goal of treatment is to strengthen your bones and lower your risk for a fracture. Include calcium and vitamin D in your diet. Calcium is important for bone health, and vitamin D helps your body absorb calcium. Talk with your health care provider about screening for osteoporosis if you are a woman who is age 69 or older, or a man who is age 69 or older. This information is not intended to replace advice given to you by your health care provider. Make sure you discuss any questions you have with your health care provider. Document Revised: 04/26/2020 Document Reviewed: 04/26/2020 Elsevier Patient Education  [place] Maintenance After Age 69 After age 86, you are at a higher risk for certain long-term diseases and infections as well as injuries from falls. Falls are a major cause of broken bones and head injuries in people who are older than age 85. Getting regular preventive care can help to keep you healthy and well. Preventive care includes getting regular testing and making lifestyle changes as recommended by your health care provider. Talk with your health care provider about: Which screenings and tests you should have. A screening is a test that checks for a  disease when you have no symptoms. A diet and exercise plan that is right for you. What should I know about screenings and tests to prevent falls? Screening and testing are the best ways to find a health problem early. Early diagnosis and treatment give you the best chance of managing medical conditions that are common after age 37. Certain conditions and lifestyle choices may make you more likely to have a fall. Your health care provider may recommend: Regular vision checks. Poor vision and conditions such as cataracts can make you more likely to have a fall. If you wear glasses, make sure to get your prescription updated if your vision changes. Medicine review. Work with your health care provider to regularly review all of the medicines you are taking, including over-the-counter medicines. Ask your health care provider about any side effects that may make you more likely to have a fall. Tell your health care provider if any medicines that you take make you feel dizzy or sleepy. Osteoporosis screening. Osteoporosis is a condition that causes the bones to get weaker. This can make the bones weak and cause them to break more easily. Blood pressure screening. Blood pressure changes and medicines to control blood pressure can make you feel dizzy. Strength and balance checks. Your health care provider may recommend certain tests to check your  strength and balance while standing, walking, or changing positions. Foot health exam. Foot pain and numbness, as well as not wearing proper footwear, can make you more likely to have a fall. Depression screening. You may be more likely to have a fall if you have a fear of falling, feel emotionally low, or feel unable to do activities that you used to do. Alcohol use screening. Using too much alcohol can affect your balance and may make you more likely to have a fall. What actions can I take to lower my risk of falls? General instructions Talk with your health care  provider about your risks for falling. Tell your health care provider if: You fall. Be sure to tell your health care provider about all falls, even ones that seem minor. You feel dizzy, sleepy, or off-balance. Take over-the-counter and prescription medicines only as told by your health care provider. These include any supplements. Eat a healthy diet and maintain a healthy weight. A healthy diet includes low-fat dairy products, low-fat (lean) meats, and fiber from whole grains, beans, and lots of fruits and vegetables. Home safety Remove any tripping hazards, such as rugs, cords, and clutter. Install safety equipment such as grab bars in bathrooms and safety rails on stairs. Keep rooms and walkways well-lit. Activity  Follow a regular exercise program to stay fit. This will help you maintain your balance. Ask your health care provider what types of exercise are appropriate for you. If you need a cane or walker, use it as recommended by your health care provider. Wear supportive shoes that have nonskid soles. Lifestyle Do not drink alcohol if your health care provider tells you not to drink. If you drink alcohol, limit how much you have: 0-1 drink a day for women. 0-2 drinks a day for men. Be aware of how much alcohol is in your drink. In the [place] one drink equals one typical bottle of beer (12 oz), one-half glass of wine (5 oz), or one shot of hard liquor (1 oz). Do not use any products that contain nicotine or tobacco, such as cigarettes and e-cigarettes. If you need help quitting, ask your health care provider. Summary Having a healthy lifestyle and getting preventive care can help to protect your health and wellness after age 52. Screening and testing are the best way to find a health problem early and help you avoid having a fall. Early diagnosis and treatment give you the best chance for managing medical conditions that are more common for people who are older than age 19. Falls are a  major cause of broken bones and head injuries in people who are older than age 17. Take precautions to prevent a fall at home. Work with your health care provider to learn what changes you can make to improve your health and wellness and to prevent falls. This information is not intended to replace advice given to you by your health care provider. Make sure you discuss any questions you have with your health care provider. Document Revised: 01/18/2021 Document Reviewed: 10/26/2020 Elsevier Patient Education  2022 [employer].

## 2021-08-22 NOTE — Progress Notes (Signed)
L 

## 2021-08-23 LAB — LIPID PANEL W/REFLEX DIRECT LDL
Cholesterol: 303 mg/dL — ABNORMAL HIGH (ref <200)
HDL: 68 mg/dL (ref >=50)
LDL Cholesterol (Calc): 197 mg/dL (calc) — ABNORMAL HIGH
Non-HDL Cholesterol (Calc): 235 mg/dL (calc) — ABNORMAL HIGH (ref <130)
Total CHOL/HDL Ratio: 4.5 (calc) (ref <5.0)
Triglycerides: 199 mg/dL — ABNORMAL HIGH (ref <150)

## 2021-08-23 LAB — CBC WITH DIFFERENTIAL/PLATELET
Absolute Monocytes: 799 cells/uL (ref 200–950)
Basophils Absolute: 81 cells/uL (ref 0–200)
Basophils Relative: 1.1 %
Eosinophils Absolute: 296 cells/uL (ref 15–500)
Eosinophils Relative: 4 %
HCT: 40.3 % (ref 35.0–45.0)
Hemoglobin: 13.1 g/dL (ref 11.7–15.5)
Lymphs Abs: 3189 cells/uL (ref 850–3900)
MCH: 29.8 pg (ref 27.0–33.0)
MCHC: 32.5 g/dL (ref 32.0–36.0)
MCV: 91.6 fL (ref 80.0–100.0)
MPV: 10.4 fL (ref 7.5–12.5)
Monocytes Relative: 10.8 %
Neutro Abs: 3034 cells/uL (ref 1500–7800)
Neutrophils Relative %: 41 %
Platelets: 317 10*3/uL (ref 140–400)
RBC: 4.4 10*6/uL (ref 3.80–5.10)
RDW: 13.1 % (ref 11.0–15.0)
Total Lymphocyte: 43.1 %
WBC: 7.4 10*3/uL (ref 3.8–10.8)

## 2021-08-23 LAB — COMPLETE METABOLIC PANEL WITH GFR
AG Ratio: 1.5 (calc) (ref 1.0–2.5)
ALT: 16 U/L (ref 6–29)
AST: 12 U/L (ref 10–35)
Albumin: 4.4 g/dL (ref 3.6–5.1)
Alkaline phosphatase (APISO): 78 U/L (ref 37–153)
BUN/Creatinine Ratio: 21 (calc) (ref 6–22)
BUN: 22 mg/dL (ref 7–25)
CO2: 29 mmol/L (ref 20–32)
Calcium: 10.2 mg/dL (ref 8.6–10.4)
Chloride: 101 mmol/L (ref 98–110)
Creat: 1.06 mg/dL — ABNORMAL HIGH (ref 0.50–1.05)
Globulin: 3 g/dL (calc) (ref 1.9–3.7)
Glucose, Bld: 85 mg/dL (ref 65–99)
Potassium: 4.6 mmol/L (ref 3.5–5.3)
Sodium: 139 mmol/L (ref 135–146)
Total Bilirubin: 0.5 mg/dL (ref 0.2–1.2)
Total Protein: 7.4 g/dL (ref 6.1–8.1)
eGFR: 57 mL/min/{1.73_m2} — ABNORMAL LOW (ref >=60)

## 2021-08-23 LAB — TSH: TSH: 2.41 mIU/L (ref 0.40–4.50)

## 2021-08-23 NOTE — Progress Notes (Signed)
Trella,   Thyroid looks good.  Kidney function stable from 12 months ago with GRF 57. There are medications that have show to prevent further decline of kidney function called farxiga if you would like to discuss more.  Your cholesterol is very high.  Your LDL is 197 where our primary prevention goal is under 100.  I would strongly recommend a statin to lower LDL and CV risk. If you will not do that then will you consider any cholesterol lowering medications? Other medications do not have the data statins do but it would be something. Thoughts?  Your HDL, good cholesterol, is good.

## 2021-08-26 DIAGNOSIS — R030 Elevated blood-pressure reading, without diagnosis of hypertension: Secondary | ICD-10-CM | POA: Insufficient documentation

## 2021-08-26 DIAGNOSIS — E781 Pure hyperglyceridemia: Secondary | ICD-10-CM | POA: Insufficient documentation

## 2021-08-26 DIAGNOSIS — E785 Hyperlipidemia, unspecified: Secondary | ICD-10-CM | POA: Insufficient documentation

## 2021-08-26 DIAGNOSIS — M5030 Other cervical disc degeneration, unspecified cervical region: Secondary | ICD-10-CM | POA: Insufficient documentation

## 2021-08-26 DIAGNOSIS — M542 Cervicalgia: Secondary | ICD-10-CM | POA: Insufficient documentation

## 2021-08-26 DIAGNOSIS — G8929 Other chronic pain: Secondary | ICD-10-CM | POA: Insufficient documentation

## 2021-08-26 MED ORDER — OMEGA-3-ACID ETHYL ESTERS 1 G PO CAPS: 2.0000 g | ORAL_CAPSULE | Freq: Two times a day (BID) | ORAL | 3 refills | Status: DC

## 2021-08-26 NOTE — Progress Notes (Signed)
Subjective:    Patient ID: Angelica Hoover, female    DOB: 16-Apr-1951, 70 y.o.   MRN: [id number]  HPI Pt is a 70 yo female with OSA, HTN, dyslipidemia, Gout, and OA who presents to the clinic for medication follow up.   She is overall doing well. Using CPAP every night. Energy is good. Mood is good. No gout flares. She has been using alpha lipoic acid and doing really well for her pain. The diclofenac gel as needed works well too. Flexeril as needed has helped as well with her neck pain from cervical DDD.   Known osteoporosis but does not want medication. Taking vitamin D and calcium. She is trying to be active.   Taking BP medication daily. No CP, palpitations, headaches or vision changes. She does check BP at home and always better than here in the 120s to 130s over 70-80s.   Chronic neck and thumb pain. Voltaren gel helps and flexeril helps. Uses both as needed.   .. Active Ambulatory Problems    Diagnosis Date Noted   Chronic idiopathic constipation 03/22/2017   History of colon polyps 03/22/2017   Family history of colon cancer 03/22/2017   Family history of pancreatic cancer 03/22/2017   Pain of upper abdomen 03/22/2017   RLS (restless legs syndrome) 08/03/2017   History of left breast cancer 08/03/2017   OSA on CPAP 08/05/2017   Essential hypertension 08/28/2017   Primary osteoarthritis of both knees 08/28/2017   Allergic contact dermatitis due to other agents 08/28/2017   Rosacea 08/28/2017   Osteoporosis 11/04/2017   Apical lung scarring 12/15/2018   Overweight (BMI 25.0-29.9) 12/22/2018   Reactive airway disease 01/24/2019   Acne vulgaris 09/14/2019   CKD (chronic kidney disease) stage 3, GFR 30-59 ml/min (HCC) 05/09/2020   Serum calcium elevated 08/27/2020   Chronic pain of right thumb 08/27/2020   Gout involving toe of left foot 08/27/2020   Bunion, left 08/27/2020   Dyslipidemia 08/26/2021   DDD (degenerative disc disease), cervical 08/26/2021   Resolved  Ambulatory Problems    Diagnosis Date Noted   No Resolved Ambulatory Problems   Past Medical History:  Diagnosis Date   Breast cancer ([place])    Personal history of chemotherapy     Review of Systems See HPI.     Objective:   Physical Exam Vitals reviewed.  Constitutional:      Appearance: Normal appearance.  HENT:     Head: Normocephalic.  Neck:     Vascular: No carotid bruit.  Cardiovascular:     Rate and Rhythm: Normal rate and regular rhythm.     Heart sounds: Normal heart sounds.  Pulmonary:     Effort: Pulmonary effort is normal.     Breath sounds: Normal breath sounds.  Abdominal:     General: Bowel sounds are normal. There is no distension.     Palpations: Abdomen is soft. There is no mass.     Tenderness: There is no abdominal tenderness. There is no right CVA tenderness, left CVA tenderness or guarding.  Musculoskeletal:     Cervical back: No tenderness.     Right lower leg: No edema.     Left lower leg: No edema.  Lymphadenopathy:     Cervical: No cervical adenopathy.  Neurological:     General: No focal deficit present.     Mental Status: She is alert and oriented to person, place, and time.  Psychiatric:        Mood and Affect: Mood  normal.  .. Depression screen [facility] 2/9 08/22/2021 09/14/2019 01/18/2019 12/21/2018 08/28/2017  Decreased Interest 0 0 0 0 0  Down, Depressed, Hopeless 0 0 0 0 0  PHQ - 2 Score 0 0 0 0 0  Altered sleeping 0 0 2 - -  Tired, decreased energy 0 0 3 - -  Change in appetite 0 0 0 - -  Feeling bad or failure about yourself  0 0 0 - -  Trouble concentrating 0 0 0 - -  Moving slowly or fidgety/restless 0 0 1 - -  Suicidal thoughts 0 0 0 - -  PHQ-9 Score 0 0 6 - -  Difficult doing work/chores Not difficult at all Not difficult at all Somewhat difficult - -           Assessment & Plan:  Marland KitchenMarland KitchenPatsy was seen today for follow-up.  Diagnoses and all orders for this visit:  Essential hypertension -     COMPLETE METABOLIC PANEL WITH  GFR -     lisinopril-hydrochlorothiazide (ZESTORETIC) 20-25 MG tablet; Take 1 tablet by mouth daily.  Stage 3a chronic kidney disease (HCC) -     COMPLETE METABOLIC PANEL WITH GFR  Screening for lipid disorders -     Lipid Panel w/reflex Direct LDL  Thyroid disorder screen -     TSH  Medication management -     TSH -     Lipid Panel w/reflex Direct LDL -     COMPLETE METABOLIC PANEL WITH GFR -     CBC with Differential/Platelet  Gout involving toe of left foot, unspecified cause, unspecified chronicity -     allopurinol (ZYLOPRIM) 100 MG tablet; Take 2 tablets (200 mg total) by mouth daily.  Flu vaccine need -     Flu Vaccine QUAD High Dose(Fluad)  OSA on CPAP  Chronic idiopathic constipation  Age related osteoporosis, unspecified pathological fracture presence  Chronic pain of right thumb -     diclofenac Sodium (VOLTAREN) 1 % GEL; Apply 4 g topically 4 (four) times daily. To affected joint.  Dyslipidemia  DDD (degenerative disc disease), cervical -     cyclobenzaprine (FLEXERIL) 5 MG tablet; Take 1-2 tablets (5-10 mg total) by mouth every 8 (eight) hours as needed.  Chronic neck pain -     cyclobenzaprine (FLEXERIL) 5 MG tablet; Take 1-2 tablets (5-10 mg total) by mouth every 8 (eight) hours as needed.  Hypertriglyceridemia -     omega-3 acid ethyl esters (LOVAZA) 1 g capsule; Take 2 capsules (2 g total) by mouth 2 (two) times daily.   Fasting labs ordered.  Monitor CMP for CKD.  Monitor TG for elevated TG and dyslipidemia.  Uric acid level for gout.   PHQ no concerns.   Discussed osteoporosis. Make sure taking vitamin D and calcium. Discussed medications and risk of not treating. Repeat bone density to check progression.   Continue as needed voltaren gel and flexeril.   BP is elevated. Discussed DASH diet and mediterranean choices. Goal under 130/80 due to CKD. Start monitoring at home and bring in log. Recheck in 3 months.   Follow up in 3 months.

## 2021-08-26 NOTE — Progress Notes (Signed)
Ordered lovaza. We will see what coverage looks like.

## 2021-12-05 LAB — HM MAMMOGRAPHY

## 2021-12-05 LAB — HM DEXA SCAN

## 2021-12-11 ENCOUNTER — Encounter: Payer: Self-pay | Admitting: Physician Assistant

## 2021-12-13 ENCOUNTER — Telehealth: Payer: Self-pay

## 2021-12-13 NOTE — Telephone Encounter (Signed)
Spoke with pt in regards to her bone density scan done 12/05/21, pt states she not int any significant  pain  and will has an appt to follow up with jade in march.

## 2022-02-19 ENCOUNTER — Ambulatory Visit: Payer: Federal, State, Local not specified - PPO | Admitting: Physician Assistant

## 2022-02-26 ENCOUNTER — Other Ambulatory Visit: Payer: Self-pay | Admitting: Physician Assistant

## 2022-02-26 DIAGNOSIS — M5030 Other cervical disc degeneration, unspecified cervical region: Secondary | ICD-10-CM

## 2022-02-26 DIAGNOSIS — G8929 Other chronic pain: Secondary | ICD-10-CM

## 2022-05-08 ENCOUNTER — Other Ambulatory Visit: Payer: Self-pay | Admitting: Physician Assistant

## 2022-05-08 DIAGNOSIS — I10 Essential (primary) hypertension: Secondary | ICD-10-CM

## 2022-05-08 NOTE — Telephone Encounter (Signed)
Spoke with patient and she stated she had something going on with her eye & she wants to get that figured out before she makes an appt. AMUCK

## 2022-06-07 ENCOUNTER — Other Ambulatory Visit: Payer: Self-pay | Admitting: Physician Assistant

## 2022-06-07 DIAGNOSIS — I10 Essential (primary) hypertension: Secondary | ICD-10-CM

## 2022-06-21 ENCOUNTER — Other Ambulatory Visit: Payer: Self-pay | Admitting: Physician Assistant

## 2022-06-21 DIAGNOSIS — I10 Essential (primary) hypertension: Secondary | ICD-10-CM

## 2022-08-22 ENCOUNTER — Other Ambulatory Visit: Payer: Self-pay | Admitting: Physician Assistant

## 2022-08-22 DIAGNOSIS — M810 Age-related osteoporosis without current pathological fracture: Secondary | ICD-10-CM

## 2022-09-26 ENCOUNTER — Other Ambulatory Visit: Payer: Self-pay | Admitting: Physician Assistant

## 2022-09-26 DIAGNOSIS — M109 Gout, unspecified: Secondary | ICD-10-CM

## 2022-10-06 ENCOUNTER — Ambulatory Visit: Payer: Federal, State, Local not specified - PPO | Admitting: Physician Assistant

## 2022-10-06 VITALS — BP 138/70 | HR 83 | Ht 68.0 in | Wt 184.0 lb

## 2022-10-06 DIAGNOSIS — Z1329 Encounter for screening for other suspected endocrine disorder: Secondary | ICD-10-CM

## 2022-10-06 DIAGNOSIS — E781 Pure hyperglyceridemia: Secondary | ICD-10-CM | POA: Diagnosis not present

## 2022-10-06 DIAGNOSIS — I10 Essential (primary) hypertension: Secondary | ICD-10-CM

## 2022-10-06 DIAGNOSIS — Z1159 Encounter for screening for other viral diseases: Secondary | ICD-10-CM

## 2022-10-06 DIAGNOSIS — N1831 Chronic kidney disease, stage 3a: Secondary | ICD-10-CM

## 2022-10-06 DIAGNOSIS — M109 Gout, unspecified: Secondary | ICD-10-CM

## 2022-10-06 DIAGNOSIS — M5030 Other cervical disc degeneration, unspecified cervical region: Secondary | ICD-10-CM

## 2022-10-06 DIAGNOSIS — M810 Age-related osteoporosis without current pathological fracture: Secondary | ICD-10-CM

## 2022-10-06 DIAGNOSIS — Z0000 Encounter for general adult medical examination without abnormal findings: Secondary | ICD-10-CM

## 2022-10-06 DIAGNOSIS — G4733 Obstructive sleep apnea (adult) (pediatric): Secondary | ICD-10-CM

## 2022-10-06 DIAGNOSIS — M542 Cervicalgia: Secondary | ICD-10-CM

## 2022-10-06 DIAGNOSIS — G8929 Other chronic pain: Secondary | ICD-10-CM

## 2022-10-06 DIAGNOSIS — H259 Unspecified age-related cataract: Secondary | ICD-10-CM

## 2022-10-06 MED ORDER — ALLOPURINOL 100 MG PO TABS: 200.0000 mg | ORAL_TABLET | Freq: Every day | ORAL | 3 refills | Status: DC

## 2022-10-06 MED ORDER — LISINOPRIL-HYDROCHLOROTHIAZIDE 20-25 MG PO TABS: 1.0000 | ORAL_TABLET | Freq: Every day | ORAL | 1 refills | Status: DC

## 2022-10-06 MED ORDER — CYCLOBENZAPRINE HCL 5 MG PO TABS: 5.0000 mg | ORAL_TABLET | Freq: Three times a day (TID) | ORAL | 1 refills | Status: DC | PRN

## 2022-10-06 NOTE — Progress Notes (Unsigned)
Established Patient Office Visit  Subjective   Patient ID: Angelica Hoover, female    DOB: 1951-06-23  Age: 71 y.o. MRN: [id number]  Chief Complaint  Patient presents with   Follow-up    HPI Pt is a 71 yo female with HTN, Gout, Osteoporosis, HLD, CKD 3 who presents to the clinic for follow up and refills.   Pt is doing really well. She is exercising and feels good. She sleeps well and has good energy. No CP, palpitations, headaches, dizziness. She is having vision changes and needs to have left cataract removed.   .. Active Ambulatory Problems    Diagnosis Date Noted   Chronic idiopathic constipation 03/22/2017   History of colon polyps 03/22/2017   Family history of colon cancer 03/22/2017   Family history of pancreatic cancer 03/22/2017   Pain of upper abdomen 03/22/2017   RLS (restless legs syndrome) 08/03/2017   History of left breast cancer 08/03/2017   OSA on CPAP 08/05/2017   Essential hypertension 08/28/2017   Primary osteoarthritis of both knees 08/28/2017   Allergic contact dermatitis due to other agents 08/28/2017   Rosacea 08/28/2017   Osteoporosis 11/04/2017   Apical lung scarring 12/15/2018   Overweight (BMI 25.0-29.9) 12/22/2018   Reactive airway disease 01/24/2019   Acne vulgaris 09/14/2019   CKD (chronic kidney disease) stage 3, GFR 30-59 ml/min (HCC) 05/09/2020   Serum calcium elevated 08/27/2020   Chronic pain of right thumb 08/27/2020   Gout involving toe of left foot 08/27/2020   Bunion, left 08/27/2020   Dyslipidemia 08/26/2021   DDD (degenerative disc disease), cervical 08/26/2021   Hypertriglyceridemia 08/26/2021   Chronic neck pain 08/26/2021   Elevated blood pressure reading 08/26/2021   Age-related cataract of both eyes 10/06/2022   Elevated LDL cholesterol level 10/07/2022   Resolved Ambulatory Problems    Diagnosis Date Noted   No Resolved Ambulatory Problems   Past Medical History:  Diagnosis Date   Breast cancer ([place])    Personal  history of chemotherapy     ROS See HPI.    Objective:     BP 138/70   Pulse 83   Ht '5\' 8"'$  (1.727 m)   Wt 184 lb (83.5 kg)   SpO2 99%   BMI 27.98 kg/m  BP Readings from Last 3 Encounters:  10/06/22 138/70  08/22/21 (!) 145/77  08/27/20 (!) 111/58   Wt Readings from Last 3 Encounters:  10/06/22 184 lb (83.5 kg)  08/22/21 189 lb (85.7 kg)  08/27/20 190 lb (86.2 kg)    ..    10/06/2022    1:38 PM 08/22/2021   11:31 AM 09/14/2019    8:57 AM 01/18/2019    1:15 PM 12/21/2018   10:06 AM  Depression screen PHQ 2/9  Decreased Interest 0 0 0 0 0  Down, Depressed, Hopeless 0 0 0 0 0  PHQ - 2 Score 0 0 0 0 0  Altered sleeping  0 0 2   Tired, decreased energy  0 0 3   Change in appetite  0 0 0   Feeling bad or failure about yourself   0 0 0   Trouble concentrating  0 0 0   Moving slowly or fidgety/restless  0 0 1   Suicidal thoughts  0 0 0   PHQ-9 Score  0 0 6   Difficult doing work/chores  Not difficult at all Not difficult at all Somewhat difficult    ..    08/22/2021   11:31 AM  09/14/2019    8:57 AM 01/18/2019    1:18 PM 12/21/2018   10:06 AM  GAD 7 : Generalized Anxiety Score  Nervous, Anxious, on Edge 0 0 2 0  Control/stop worrying 0 0 0 0  Worry too much - different things 0 0 0 0  Trouble relaxing 0 0 0 0  Restless 0 0 0 0  Easily annoyed or irritable 0 0 2 0  Afraid - awful might happen 0 0 0 0  Total GAD 7 Score 0 0 4 0  Anxiety Difficulty Not difficult at all Not difficult at all Somewhat difficult Not difficult at all      Physical Exam Constitutional:      Appearance: Normal appearance.  HENT:     Head: Normocephalic.  Neck:     Vascular: No carotid bruit.  Cardiovascular:     Rate and Rhythm: Normal rate and regular rhythm.     Pulses: Normal pulses.  Pulmonary:     Effort: Pulmonary effort is normal.     Breath sounds: Normal breath sounds.  Musculoskeletal:     Cervical back: Normal range of motion and neck supple. No rigidity or  tenderness.     Right lower leg: No edema.     Left lower leg: No edema.  Lymphadenopathy:     Cervical: No cervical adenopathy.  Neurological:     General: No focal deficit present.     Mental Status: She is alert.  Psychiatric:        Mood and Affect: Mood normal.       Assessment & Plan:  Marland KitchenMarland KitchenPatsy was seen today for follow-up.  Diagnoses and all orders for this visit:  Essential hypertension -     COMPLETE METABOLIC PANEL WITH GFR -     lisinopril-hydrochlorothiazide (ZESTORETIC) 20-25 MG tablet; Take 1 tablet by mouth daily.  Thyroid disorder screen -     TSH  Hypertriglyceridemia -     Lipid Panel w/reflex Direct LDL  Physical exam -     TSH -     Lipid Panel w/reflex Direct LDL -     COMPLETE METABOLIC PANEL WITH GFR -     CBC with Differential/Platelet  DDD (degenerative disc disease), cervical -     cyclobenzaprine (FLEXERIL) 5 MG tablet; Take 1-2 tablets (5-10 mg total) by mouth every 8 (eight) hours as needed.  Chronic neck pain -     cyclobenzaprine (FLEXERIL) 5 MG tablet; Take 1-2 tablets (5-10 mg total) by mouth every 8 (eight) hours as needed.  Age-related cataract of both eyes, unspecified age-related cataract type  Gout involving toe of left foot, unspecified cause, unspecified chronicity -     allopurinol (ZYLOPRIM) 100 MG tablet; Take 2 tablets (200 mg total) by mouth daily. -     Uric acid  Encounter for hepatitis C screening test for low risk patient -     Hepatitis C Antibody  Age-related osteoporosis without current pathological fracture  Stage 3a chronic kidney disease (HCC)  OSA on CPAP   Needs labs Refilled allopurinol for cough Flexeril for chronic pain to use as needed Lisinopril/HcTZ for BP Declines vaccines.  Colonoscopy is UTD.  Mammogram UTD Bone density UTD.      Return in about 6 months (around 04/06/2023), or if symptoms worsen or fail to improve.    Iran Planas, PA-C

## 2022-10-07 ENCOUNTER — Encounter: Payer: Self-pay | Admitting: Physician Assistant

## 2022-10-07 DIAGNOSIS — E7800 Pure hypercholesterolemia, unspecified: Secondary | ICD-10-CM | POA: Insufficient documentation

## 2022-10-07 LAB — COMPLETE METABOLIC PANEL WITH GFR
AG Ratio: 1.5 (calc) (ref 1.0–2.5)
ALT: 15 U/L (ref 6–29)
AST: 15 U/L (ref 10–35)
Albumin: 4.3 g/dL (ref 3.6–5.1)
Alkaline phosphatase (APISO): 80 U/L (ref 37–153)
BUN/Creatinine Ratio: 16 (calc) (ref 6–22)
BUN: 19 mg/dL (ref 7–25)
CO2: 29 mmol/L (ref 20–32)
Calcium: 10.7 mg/dL — ABNORMAL HIGH (ref 8.6–10.4)
Chloride: 102 mmol/L (ref 98–110)
Creat: 1.2 mg/dL — ABNORMAL HIGH (ref 0.60–1.00)
Globulin: 2.9 g/dL (calc) (ref 1.9–3.7)
Glucose, Bld: 89 mg/dL (ref 65–99)
Potassium: 4.4 mmol/L (ref 3.5–5.3)
Sodium: 140 mmol/L (ref 135–146)
Total Bilirubin: 0.5 mg/dL (ref 0.2–1.2)
Total Protein: 7.2 g/dL (ref 6.1–8.1)
eGFR: 49 mL/min/{1.73_m2} — ABNORMAL LOW (ref >=60)

## 2022-10-07 LAB — CBC WITH DIFFERENTIAL/PLATELET
Absolute Monocytes: 811 cells/uL (ref 200–950)
Basophils Absolute: 94 cells/uL (ref 0–200)
Basophils Relative: 1.2 %
Eosinophils Absolute: 398 cells/uL (ref 15–500)
Eosinophils Relative: 5.1 %
HCT: 38 % (ref 35.0–45.0)
Hemoglobin: 13.1 g/dL (ref 11.7–15.5)
Lymphs Abs: 2831 cells/uL (ref 850–3900)
MCH: 30.2 pg (ref 27.0–33.0)
MCHC: 34.5 g/dL (ref 32.0–36.0)
MCV: 87.6 fL (ref 80.0–100.0)
MPV: 10.5 fL (ref 7.5–12.5)
Monocytes Relative: 10.4 %
Neutro Abs: 3666 cells/uL (ref 1500–7800)
Neutrophils Relative %: 47 %
Platelets: 331 10*3/uL (ref 140–400)
RBC: 4.34 10*6/uL (ref 3.80–5.10)
RDW: 12.8 % (ref 11.0–15.0)
Total Lymphocyte: 36.3 %
WBC: 7.8 10*3/uL (ref 3.8–10.8)

## 2022-10-07 LAB — LIPID PANEL W/REFLEX DIRECT LDL
Cholesterol: 285 mg/dL — ABNORMAL HIGH (ref <200)
HDL: 70 mg/dL (ref >=50)
LDL Cholesterol (Calc): 189 mg/dL (calc) — ABNORMAL HIGH
Non-HDL Cholesterol (Calc): 215 mg/dL (calc) — ABNORMAL HIGH (ref <130)
Total CHOL/HDL Ratio: 4.1 (calc) (ref <5.0)
Triglycerides: 129 mg/dL (ref <150)

## 2022-10-07 LAB — TSH: TSH: 1.51 mIU/L (ref 0.40–4.50)

## 2022-10-07 LAB — HEPATITIS C ANTIBODY: Hepatitis C Ab: NONREACTIVE

## 2022-10-07 LAB — URIC ACID: Uric Acid, Serum: 5.1 mg/dL (ref 2.5–7.0)

## 2022-10-07 NOTE — Progress Notes (Signed)
Angelica Hoover,  Thyroid looks great.  Uric acid to goal. Kidney function dropped a little more. There is a medication called farxiga that has data that prevents further kidney function decline. I would like for you to consider this.  HDL looks great.  LDL not to goal.   Your overall CV risk is elevated quite a bit at 24 percent.  You really do need to consider statin therapy or at the very least some type of cholesterol medication. Would you consider?   Marland KitchenMarland KitchenThe 10-year ASCVD risk score (Arnett DK, et al., 2019) is: 24%   Values used to calculate the score:     Age: 71 years     Sex: Female     Is Non-Hispanic African American: No     Diabetic: No     Tobacco smoker: Yes     Systolic Blood Pressure: 557 mmHg     Is BP treated: Yes     HDL Cholesterol: 70 mg/dL     Total Cholesterol: 285 mg/dL

## 2022-10-08 ENCOUNTER — Encounter: Payer: Self-pay | Admitting: Physician Assistant

## 2022-10-10 ENCOUNTER — Encounter: Payer: Self-pay | Admitting: Neurology

## 2022-11-11 DIAGNOSIS — H2512 Age-related nuclear cataract, left eye: Secondary | ICD-10-CM | POA: Insufficient documentation

## 2022-12-27 ENCOUNTER — Other Ambulatory Visit: Payer: Self-pay | Admitting: Physician Assistant

## 2022-12-27 DIAGNOSIS — G8929 Other chronic pain: Secondary | ICD-10-CM

## 2022-12-27 DIAGNOSIS — M5030 Other cervical disc degeneration, unspecified cervical region: Secondary | ICD-10-CM

## 2022-12-31 ENCOUNTER — Other Ambulatory Visit: Payer: Self-pay | Admitting: Physician Assistant

## 2022-12-31 DIAGNOSIS — G8929 Other chronic pain: Secondary | ICD-10-CM

## 2023-01-02 ENCOUNTER — Other Ambulatory Visit: Payer: Self-pay

## 2023-01-02 DIAGNOSIS — G8929 Other chronic pain: Secondary | ICD-10-CM

## 2023-01-02 MED ORDER — DICLOFENAC SODIUM 1 % EX GEL: 4.0000 g | Freq: Four times a day (QID) | CUTANEOUS | 1 refills | Status: DC

## 2023-04-10 ENCOUNTER — Other Ambulatory Visit: Payer: Self-pay | Admitting: Physician Assistant

## 2023-04-10 DIAGNOSIS — G8929 Other chronic pain: Secondary | ICD-10-CM

## 2023-04-10 DIAGNOSIS — I10 Essential (primary) hypertension: Secondary | ICD-10-CM

## 2023-04-10 DIAGNOSIS — M5030 Other cervical disc degeneration, unspecified cervical region: Secondary | ICD-10-CM

## 2023-04-27 ENCOUNTER — Other Ambulatory Visit: Payer: Self-pay | Admitting: Family Medicine

## 2023-04-27 DIAGNOSIS — M5030 Other cervical disc degeneration, unspecified cervical region: Secondary | ICD-10-CM

## 2023-04-27 DIAGNOSIS — G8929 Other chronic pain: Secondary | ICD-10-CM

## 2023-05-11 ENCOUNTER — Other Ambulatory Visit: Payer: Self-pay | Admitting: Physician Assistant

## 2023-05-11 DIAGNOSIS — G8929 Other chronic pain: Secondary | ICD-10-CM

## 2023-05-30 ENCOUNTER — Other Ambulatory Visit: Payer: Self-pay | Admitting: Family Medicine

## 2023-05-30 DIAGNOSIS — G8929 Other chronic pain: Secondary | ICD-10-CM

## 2023-05-30 DIAGNOSIS — M5030 Other cervical disc degeneration, unspecified cervical region: Secondary | ICD-10-CM

## 2023-12-16 ENCOUNTER — Other Ambulatory Visit: Payer: Self-pay | Admitting: Physician Assistant

## 2023-12-16 DIAGNOSIS — M109 Gout, unspecified: Secondary | ICD-10-CM

## 2024-01-07 ENCOUNTER — Other Ambulatory Visit: Payer: Self-pay | Admitting: Family Medicine

## 2024-01-07 DIAGNOSIS — I10 Essential (primary) hypertension: Secondary | ICD-10-CM

## 2024-01-09 NOTE — Telephone Encounter (Signed)
 Pls contact pt to schedule HTN appt with [facility]. Thx

## 2024-01-11 NOTE — Telephone Encounter (Signed)
 Tried calling patient and her emergency contact both phones state not longer in service check directory was the message I got Phone number listed for patient [phone] and husband phone number is [phone]

## 2024-01-15 ENCOUNTER — Other Ambulatory Visit: Payer: Self-pay | Admitting: Family Medicine

## 2024-01-15 DIAGNOSIS — I10 Essential (primary) hypertension: Secondary | ICD-10-CM

## 2024-03-19 ENCOUNTER — Other Ambulatory Visit: Payer: Self-pay | Admitting: Physician Assistant

## 2024-03-19 DIAGNOSIS — M109 Gout, unspecified: Secondary | ICD-10-CM

## 2024-04-16 ENCOUNTER — Other Ambulatory Visit: Payer: Self-pay | Admitting: Physician Assistant

## 2024-04-16 DIAGNOSIS — I10 Essential (primary) hypertension: Secondary | ICD-10-CM

## 2024-04-19 NOTE — Telephone Encounter (Signed)
 Called patient, call could not be completed as dialed, will try back again later, thanks.

## 2024-04-21 ENCOUNTER — Telehealth: Payer: Self-pay

## 2024-04-21 NOTE — Telephone Encounter (Signed)
 Copied from CRM [id number]. Topic: Clinical - Prescription Issue >> Apr 21, 2024  8:51 AM Karole Pacer C wrote: Reason for CRM: Patient states she went to pick up the following medication: lisinopril -hydrochlorothiazide  (ZESTORETIC ) 20-25 MG tablet and was only provided with a 30 day supply. Patient states she normally picks up a 90 day supply and would like to know if an additional  60 tablets could be sent to the [facility] - [place], [place] - [address]

## 2024-04-21 NOTE — Telephone Encounter (Signed)
 Patient last OV was November 2023.  Called home # ( number is not a good number ) called patient husband Angelica Hoover # - left a message requesting a return call regarding this prescription and needing to schedule a visit with the provider.  Left return call back  number as well.

## 2024-04-22 ENCOUNTER — Other Ambulatory Visit: Payer: Self-pay | Admitting: Physician Assistant

## 2024-04-22 DIAGNOSIS — I10 Essential (primary) hypertension: Secondary | ICD-10-CM

## 2024-04-25 MED ORDER — LISINOPRIL-HYDROCHLOROTHIAZIDE 20-25 MG PO TABS: 1.0000 | ORAL_TABLET | Freq: Every day | ORAL | 0 refills | Status: DC

## 2024-04-25 NOTE — Telephone Encounter (Signed)
 Also attempted call to patient husband Angelica Hoover- phone rang without answer . Could not leave a voice mail message.

## 2024-04-25 NOTE — Addendum Note (Signed)
 Addended by: Araceli Knight on: 04/25/2024 05:03 PM   Modules accepted: Orders

## 2024-04-25 NOTE — Telephone Encounter (Signed)
 Phone number in patient's chart not a good number. Could not leave a voice mail message.

## 2024-04-26 ENCOUNTER — Telehealth: Payer: Self-pay

## 2024-04-26 NOTE — Telephone Encounter (Signed)
 Copied from CRM [id number]. Topic: Clinical - Prescription Issue >> Apr 21, 2024  8:51 AM Karole Pacer C wrote: Reason for CRM: Patient states she went to pick up the following medication: lisinopril -hydrochlorothiazide  (ZESTORETIC ) 20-25 MG tablet and was only provided with a 30 day supply. Patient states she normally picks up a 90 day supply and would like to know if an additional  60 tablets could be sent to the [facility] - [place], [place] - [address] >> Apr 26, 2024  1:91 PM Carrielelia G wrote: Attn: Dickie Found, LPN  Patient Goggins asking for you to call her back. Her and husband was hacked so they had to change their numbers. (I updated #s)   Please call regarding the medication: lisinopril -hydrochlorothiazide  (ZESTORETIC ) 20-25 MG tablet

## 2024-04-26 NOTE — Telephone Encounter (Signed)
 Spoke with patient. Informed of need for appt.  She will call back when she is near her calendar for scheduling.

## 2024-04-26 NOTE — Telephone Encounter (Signed)
 Message sent to patient via Mychart and letter printed and placed in outgoing  mail.

## 2024-04-26 NOTE — Telephone Encounter (Signed)
 Spoke with patient. Informed of need for appt. She will call back to schedule once she is near her calendar.

## 2024-05-15 ENCOUNTER — Other Ambulatory Visit: Payer: Self-pay | Admitting: Physician Assistant

## 2024-05-15 DIAGNOSIS — I10 Essential (primary) hypertension: Secondary | ICD-10-CM

## 2024-06-27 ENCOUNTER — Encounter: Payer: Self-pay | Admitting: Physician Assistant

## 2024-06-27 ENCOUNTER — Ambulatory Visit (INDEPENDENT_AMBULATORY_CARE_PROVIDER_SITE_OTHER): Payer: Self-pay | Admitting: Physician Assistant

## 2024-06-27 VITALS — BP 123/73 | HR 70 | Ht 68.0 in | Wt 193.7 lb

## 2024-06-27 DIAGNOSIS — Z79899 Other long term (current) drug therapy: Secondary | ICD-10-CM | POA: Diagnosis not present

## 2024-06-27 DIAGNOSIS — G8929 Other chronic pain: Secondary | ICD-10-CM | POA: Diagnosis not present

## 2024-06-27 DIAGNOSIS — M5030 Other cervical disc degeneration, unspecified cervical region: Secondary | ICD-10-CM | POA: Diagnosis not present

## 2024-06-27 DIAGNOSIS — K5904 Chronic idiopathic constipation: Secondary | ICD-10-CM | POA: Diagnosis not present

## 2024-06-27 DIAGNOSIS — E781 Pure hyperglyceridemia: Secondary | ICD-10-CM

## 2024-06-27 DIAGNOSIS — I10 Essential (primary) hypertension: Secondary | ICD-10-CM

## 2024-06-27 DIAGNOSIS — G4733 Obstructive sleep apnea (adult) (pediatric): Secondary | ICD-10-CM | POA: Diagnosis not present

## 2024-06-27 DIAGNOSIS — M109 Gout, unspecified: Secondary | ICD-10-CM

## 2024-06-27 MED ORDER — CYCLOBENZAPRINE HCL 5 MG PO TABS: 5.0000 mg | ORAL_TABLET | Freq: Three times a day (TID) | ORAL | 0 refills | Status: AC | PRN

## 2024-06-27 MED ORDER — LINACLOTIDE 290 MCG PO CAPS: 290.0000 ug | ORAL_CAPSULE | Freq: Every day | ORAL | 1 refills | Status: DC

## 2024-06-27 MED ORDER — LISINOPRIL-HYDROCHLOROTHIAZIDE 10-12.5 MG PO TABS
1.0000 | ORAL_TABLET | Freq: Every day | ORAL | 3 refills | Status: DC
Start: 2024-06-27 — End: 2024-09-20

## 2024-06-27 MED ORDER — ALLOPURINOL 100 MG PO TABS
200.0000 mg | ORAL_TABLET | Freq: Every day | ORAL | 1 refills | Status: DC
Start: 2024-06-27 — End: 2024-09-21

## 2024-06-27 NOTE — Progress Notes (Unsigned)
 Established Patient Office Visit  Subjective   Patient ID: Angelica Hoover, female    DOB: September 25, 1951  Age: 73 y.o. MRN: [id number]  Chief Complaint  Patient presents with   Medication Refill    HPI Pt is a 73 yo female with HTN, OSA, chronic neck pain, chronic constipation who presents to the clinic for 6 month follow up.   Pt is overall feeling well. She is very active. Denies any concerns with mood. She uses her CPAP nightly. Her gout is well controlled with no acute flares in the last year. Her constipation has worsened over past few months. Linzess  worked well in the past and would like to try again. She has been trying to control with miralax and diet but has began to have more and more problems with soft bowel movements.   Pt is compliant with lisinopril /hydrochlorothiazide  daily. She is checking her BP at home and getting low 100s -110s over 60-70s.   She continues to use flexeril  as needed for neck tightness and pain. She has no concerns   .SABRA Active Ambulatory Problems    Diagnosis Date Noted   Chronic idiopathic constipation 03/22/2017   History of colon polyps 03/22/2017   Family history of colon cancer 03/22/2017   Family history of pancreatic cancer 03/22/2017   Pain of upper abdomen 03/22/2017   RLS (restless legs syndrome) 08/03/2017   History of left breast cancer 08/03/2017   OSA on CPAP 08/05/2017   Essential hypertension 08/28/2017   Primary osteoarthritis of both knees 08/28/2017   Allergic contact dermatitis due to other agents 08/28/2017   Rosacea 08/28/2017   Osteoporosis 11/04/2017   Apical lung scarring 12/15/2018   Overweight (BMI 25.0-29.9) 12/22/2018   Reactive airway disease 01/24/2019   Acne vulgaris 09/14/2019   CKD (chronic kidney disease) stage 3, GFR 30-59 ml/min (HCC) 05/09/2020   Serum calcium elevated 08/27/2020   Chronic pain of right thumb 08/27/2020   Gout involving toe of left foot 08/27/2020   Bunion, left 08/27/2020   Dyslipidemia  08/26/2021   DDD (degenerative disc disease), cervical 08/26/2021   Hypertriglyceridemia 08/26/2021   Chronic neck pain 08/26/2021   Elevated blood pressure reading 08/26/2021   Age-related cataract of both eyes 10/06/2022   Elevated LDL cholesterol level 10/07/2022   Nuclear sclerosis, left 11/11/2022   Medication management 06/27/2024   Resolved Ambulatory Problems    Diagnosis Date Noted   No Resolved Ambulatory Problems   Past Medical History:  Diagnosis Date   Breast cancer (HCC)    Personal history of chemotherapy       ROS See HPI.    Objective:     BP 123/73 (BP Location: Left Arm, Patient Position: Sitting, Cuff Size: Normal)   Pulse 70   Ht 5' 8 (1.727 m)   Wt 193 lb 11.2 oz (87.9 kg)   SpO2 99%   BMI 29.45 kg/m  BP Readings from Last 3 Encounters:  06/27/24 123/73  10/06/22 138/70  08/22/21 (!) 145/77   Wt Readings from Last 3 Encounters:  06/27/24 193 lb 11.2 oz (87.9 kg)  10/06/22 184 lb (83.5 kg)  08/22/21 189 lb (85.7 kg)    ..    06/27/2024   11:17 AM 10/06/2022    1:38 PM 08/22/2021   11:31 AM 09/14/2019    8:57 AM 01/18/2019    1:15 PM  Depression screen PHQ 2/9  Decreased Interest 0 0 0 0 0  Down, Depressed, Hopeless 0 0 0 0 0  PHQ -  2 Score 0 0 0 0 0  Altered sleeping   0 0 2  Tired, decreased energy   0 0 3  Change in appetite   0 0 0  Feeling bad or failure about yourself    0 0 0  Trouble concentrating   0 0 0  Moving slowly or fidgety/restless   0 0 1  Suicidal thoughts   0 0 0  PHQ-9 Score   0 0 6  Difficult doing work/chores   Not difficult at all Not difficult at all Somewhat difficult     Physical Exam Constitutional:      Appearance: Normal appearance.  HENT:     Head: Normocephalic.  Cardiovascular:     Rate and Rhythm: Normal rate and regular rhythm.  Pulmonary:     Effort: Pulmonary effort is normal.     Breath sounds: Normal breath sounds.  Musculoskeletal:     Right lower leg: No edema.     Left lower leg:  No edema.  Neurological:     General: No focal deficit present.     Mental Status: She is alert and oriented to person, place, and time.  Psychiatric:        Mood and Affect: Mood normal.        Assessment & Plan:  SABRASABRAPatsy was seen today for medication refill.  Diagnoses and all orders for this visit:  Essential hypertension -     CMP14+EGFR -     lisinopril -hydrochlorothiazide  (ZESTORETIC ) 10-12.5 MG tablet; Take 1 tablet by mouth daily.  OSA on CPAP  Hypertriglyceridemia -     Lipid panel  Medication management -     Lipid panel -     CMP14+EGFR -     Uric acid -     TSH + free T4 -     CBC w/Diff/Platelet  Gout involving toe of left foot, unspecified cause, unspecified chronicity -     Uric acid -     allopurinol  (ZYLOPRIM ) 100 MG tablet; Take 2 tablets (200 mg total) by mouth daily.  DDD (degenerative disc disease), cervical -     cyclobenzaprine  (FLEXERIL ) 5 MG tablet; Take 1-2 tablets (5-10 mg total) by mouth every 8 (eight) hours as needed.  Chronic neck pain -     cyclobenzaprine  (FLEXERIL ) 5 MG tablet; Take 1-2 tablets (5-10 mg total) by mouth every 8 (eight) hours as needed.  Chronic idiopathic constipation -     linaclotide  (LINZESS ) 290 MCG CAPS capsule; Take 1 capsule (290 mcg total) by mouth daily before breakfast.   BP on 2nd recheck looks great BP at home are low, decreased lisinopril /hydrochlorothiazide  today, continue to monitor BP at home keeping BP under 130/80 Continue on CPAP nightly Fasting labs ordered today Pt would like to restart linzess  for constipation she had been on previously and stopped try to do OTC but that is not working right now Failed ducolax, colace, miralax  PT declined mammogram, bone density, shingles and flu vaccine. Pt would like to not do a lot of screenings. She reports she does not want to know if she has cancer. She is aware of risk.   Return in about 6 months (around 12/28/2024).    Keil Pickering, PA-C

## 2024-06-27 NOTE — Patient Instructions (Signed)
 Decreased to lisinopril /hydrochlorothiazide  10/12.5mg  daily.

## 2024-06-28 ENCOUNTER — Encounter: Payer: Self-pay | Admitting: Physician Assistant

## 2024-06-28 ENCOUNTER — Ambulatory Visit: Payer: Self-pay | Admitting: Physician Assistant

## 2024-06-28 DIAGNOSIS — N1832 Chronic kidney disease, stage 3b: Secondary | ICD-10-CM

## 2024-06-28 DIAGNOSIS — E7800 Pure hypercholesterolemia, unspecified: Secondary | ICD-10-CM

## 2024-06-28 LAB — CBC WITH DIFFERENTIAL/PLATELET
Basophils Absolute: 0.1 x10E3/uL (ref 0.0–0.2)
Basos: 1 %
EOS (ABSOLUTE): 0.2 x10E3/uL (ref 0.0–0.4)
Eos: 3 %
Hematocrit: 40.8 % (ref 34.0–46.6)
Hemoglobin: 12.8 g/dL (ref 11.1–15.9)
Immature Grans (Abs): 0 x10E3/uL (ref 0.0–0.1)
Immature Granulocytes: 0 %
Lymphocytes Absolute: 3.2 x10E3/uL — ABNORMAL HIGH (ref 0.7–3.1)
Lymphs: 35 %
MCH: 29.6 pg (ref 26.6–33.0)
MCHC: 31.4 g/dL — ABNORMAL LOW (ref 31.5–35.7)
MCV: 94 fL (ref 79–97)
Monocytes Absolute: 0.8 x10E3/uL (ref 0.1–0.9)
Monocytes: 9 %
Neutrophils Absolute: 4.6 x10E3/uL (ref 1.4–7.0)
Neutrophils: 52 %
Platelets: 373 x10E3/uL (ref 150–450)
RBC: 4.32 x10E6/uL (ref 3.77–5.28)
RDW: 13.6 % (ref 11.7–15.4)
WBC: 8.9 x10E3/uL (ref 3.4–10.8)

## 2024-06-28 LAB — LIPID PANEL
Chol/HDL Ratio: 3.7 ratio (ref 0.0–4.4)
Cholesterol, Total: 294 mg/dL — ABNORMAL HIGH (ref 100–199)
HDL: 80 mg/dL (ref >39)
LDL Chol Calc (NIH): 191 mg/dL — ABNORMAL HIGH (ref 0–99)
Triglycerides: 132 mg/dL (ref 0–149)
VLDL Cholesterol Cal: 23 mg/dL (ref 5–40)

## 2024-06-28 LAB — CMP14+EGFR
ALT: 21 IU/L (ref 0–32)
AST: 16 IU/L (ref 0–40)
Albumin: 4.6 g/dL (ref 3.8–4.8)
Alkaline Phosphatase: 88 IU/L (ref 44–121)
BUN/Creatinine Ratio: 21 (ref 12–28)
BUN: 26 mg/dL (ref 8–27)
Bilirubin Total: 0.3 mg/dL (ref 0.0–1.2)
CO2: 23 mmol/L (ref 20–29)
Calcium: 10.5 mg/dL — ABNORMAL HIGH (ref 8.7–10.3)
Chloride: 100 mmol/L (ref 96–106)
Creatinine, Ser: 1.22 mg/dL — ABNORMAL HIGH (ref 0.57–1.00)
Globulin, Total: 2.8 g/dL (ref 1.5–4.5)
Glucose: 99 mg/dL (ref 70–99)
Potassium: 4.5 mmol/L (ref 3.5–5.2)
Sodium: 137 mmol/L (ref 134–144)
Total Protein: 7.4 g/dL (ref 6.0–8.5)
eGFR: 47 mL/min/1.73 — ABNORMAL LOW (ref >59)

## 2024-06-28 LAB — URIC ACID: Uric Acid: 4.7 mg/dL (ref 3.1–7.9)

## 2024-06-28 LAB — TSH+FREE T4
Free T4: 1.27 ng/dL (ref 0.82–1.77)
TSH: 2.05 u[IU]/mL (ref 0.450–4.500)

## 2024-06-28 MED ORDER — DAPAGLIFLOZIN PROPANEDIOL 5 MG PO TABS: 5.0000 mg | ORAL_TABLET | Freq: Every day | ORAL | 2 refills | Status: DC

## 2024-06-28 NOTE — Progress Notes (Signed)
 Kam[place]e,   Uric acid normal controlled range.  Thyroid looks good.  Kidney function continues to drop a little. Kidneys do look a little dry. Make sure you are drinking 60oz of water a day.  We could consider starting a medication that has been shown to keep kidney function stable for longer. It is called farxiga . Thoughts?   HDL, good cholesterol, looks GREAT.  LDL, bad cholesterol, very elevated and not to goal.   10 year cardiovascular risk is 13.5 percent. Anything above 7.5 percent we suggest a statin. If not a statin are there any medications to lower LDL that you would consider?   SABRASABRAThe 10-year ASCVD risk score (Arnett DK, et al., 2019) is: 13.5%   Values used to calculate the score:     Age: 89 years     Clincally relevant sex: Female     Is Non-Hispanic African American: No     Diabetic: No     Tobacco smoker: No     Systolic Blood Pressure: 123 mmHg     Is BP treated: Yes     HDL Cholesterol: 80 mg/dL     Total Cholesterol: 294 mg/dL

## 2024-07-01 ENCOUNTER — Telehealth: Payer: Self-pay | Admitting: Physician Assistant

## 2024-07-01 DIAGNOSIS — K5904 Chronic idiopathic constipation: Secondary | ICD-10-CM

## 2024-07-01 NOTE — Telephone Encounter (Signed)
 Copied from CRM [id number]. Topic: General - Other >> Jul 01, 2024  2:50 PM Kevelyn M wrote: Reason for CRM: Patient is calling in because she no longer uses [facility] but she now uses [facility]. She will pick them [facility] from [facility] this time. Please in the future send to pharmacy listed below.  [facility] [address] [place], [place] [address]

## 2024-07-04 NOTE — Telephone Encounter (Signed)
 Message sent to the wrong office - Not a pt at this office, [facility].   Copied from CRM #[id number]. Topic: Clinical - Prescription Issue >> Jul 01, 2024  3:31 PM Suzette B wrote: Reason for CRM: Patient called from [phone] stating that she will not longer utilized [facility], she stated she had picked up the other medications but the price for the linzess  and Farxiga  was extremely expensive she would like the remaining two medications:   dapagliflozin  propanediol (FARXIGA ) 5 MG TABS tablet  linaclotide  (LINZESS ) 290 MCG CAPS capsule  sent over to:  [facility] - [place], [place] - [address]  Phone:  [phone] Fax: [phone]

## 2024-07-05 ENCOUNTER — Telehealth: Payer: Self-pay

## 2024-07-05 ENCOUNTER — Other Ambulatory Visit (HOSPITAL_COMMUNITY): Payer: Self-pay

## 2024-07-05 MED ORDER — DAPAGLIFLOZIN PROPANEDIOL 5 MG PO TABS: 5.0000 mg | ORAL_TABLET | Freq: Every day | ORAL | 2 refills | Status: DC

## 2024-07-05 MED ORDER — LINACLOTIDE 290 MCG PO CAPS: 290.0000 ug | ORAL_CAPSULE | Freq: Every day | ORAL | 1 refills | Status: AC

## 2024-07-05 NOTE — Telephone Encounter (Signed)
 Pharmacy Patient Advocate Encounter  Received notification from [facility] [employer] that Prior Authorization for Cyclobenzaprine  5mg  tabs has been APPROVED from 07/05/24 to 08/02/24   PA #/Case ID/Reference #: [id number]

## 2024-07-05 NOTE — Addendum Note (Signed)
 Addended by: Tyrhonda Georgiades A on: 07/05/2024 01:37 PM   Modules accepted: Orders

## 2024-07-12 DIAGNOSIS — H2513 Age-related nuclear cataract, bilateral: Secondary | ICD-10-CM | POA: Diagnosis not present

## 2024-07-12 DIAGNOSIS — H35372 Puckering of macula, left eye: Secondary | ICD-10-CM | POA: Diagnosis not present

## 2024-08-29 ENCOUNTER — Other Ambulatory Visit: Payer: Self-pay | Admitting: Physician Assistant

## 2024-08-29 DIAGNOSIS — M109 Gout, unspecified: Secondary | ICD-10-CM

## 2024-09-20 ENCOUNTER — Other Ambulatory Visit: Payer: Self-pay | Admitting: Physician Assistant

## 2024-09-20 ENCOUNTER — Telehealth: Payer: Self-pay

## 2024-09-20 DIAGNOSIS — I10 Essential (primary) hypertension: Secondary | ICD-10-CM

## 2024-09-20 DIAGNOSIS — M109 Gout, unspecified: Secondary | ICD-10-CM

## 2024-09-20 MED ORDER — DAPAGLIFLOZIN PROPANEDIOL 5 MG PO TABS: 5.0000 mg | ORAL_TABLET | Freq: Every day | ORAL | 1 refills | Status: AC

## 2024-09-20 NOTE — Telephone Encounter (Signed)
 Copied from CRM #[id number]. Topic: Clinical - Medication Refill >> Sep 20, 2024 12:52 PM Rosaria E wrote: Medication: 90 day supply  lisinopril -hydrochlorothiazide  (ZESTORETIC ) 10-12.5 MG tablet  Has the patient contacted their pharmacy? Yes (Agent: If no, request that the patient contact the pharmacy for the refill. If patient does not wish to contact the pharmacy document the reason why and proceed with request.) (Agent: If yes, when and what did the pharmacy advise?)  This is the patient's preferred pharmacy:  [facility] [address], [place] - [address] [place] [address] Phone: [phone] Fax: [phone]  Is this the correct pharmacy for this prescription? Yes If no, delete pharmacy and type the correct one.   Has the prescription been filled recently? Yes  Is the patient out of the medication? Yes  Has the patient been seen for an appointment in the last year OR does the patient have an upcoming appointment? Yes  Can we respond through MyChart? Yes  Agent: Please be advised that Rx refills may take up to 3 business days. We ask that you follow-up with your pharmacy.

## 2024-09-20 NOTE — Telephone Encounter (Signed)
 Farxiga  sent for 90 day. All medications in this class are branded right now.

## 2024-09-20 NOTE — Telephone Encounter (Signed)
 Copied from CRM [id number]. Topic: Clinical - Medication Question >> Sep 20, 2024 12:53 PM Angelica Hoover wrote: Reason for CRM: pt wants to know if her prescription for farxiga  can be changed from 30 day to 90 day? Please advise.   Wants to know if there are affordable alternatives to farxiga ?

## 2024-09-21 MED ORDER — LISINOPRIL-HYDROCHLOROTHIAZIDE 10-12.5 MG PO TABS: 1.0000 | ORAL_TABLET | Freq: Every day | ORAL | 3 refills | Status: AC

## 2024-09-21 NOTE — Telephone Encounter (Signed)
 LM for pt that 90 day supply had been sent it and that only brand names in that class of medication

## 2024-10-31 ENCOUNTER — Ambulatory Visit: Payer: Self-pay

## 2024-10-31 NOTE — Telephone Encounter (Signed)
 Patient/caller refused triage.  Reason for refusal: declined to provide reason.  Per PAS, patient refused nurse triage. Routing to clinic.   Copied from CRM #[id number]. Topic: Clinical - Red Word Triage >> Oct 31, 2024 10:27 AM Angelica Hoover wrote: Red Word that prompted transfer to Nurse Triage: R hand hurts, may have gout In pain Refused nt

## 2024-11-01 ENCOUNTER — Encounter: Payer: Self-pay | Admitting: Physician Assistant

## 2024-11-01 ENCOUNTER — Ambulatory Visit: Admitting: Physician Assistant

## 2024-11-01 VITALS — BP 160/67 | HR 75 | Ht 68.0 in | Wt 198.0 lb

## 2024-11-01 DIAGNOSIS — N1832 Chronic kidney disease, stage 3b: Secondary | ICD-10-CM | POA: Diagnosis not present

## 2024-11-01 DIAGNOSIS — I10 Essential (primary) hypertension: Secondary | ICD-10-CM

## 2024-11-01 DIAGNOSIS — M109 Gout, unspecified: Secondary | ICD-10-CM

## 2024-11-01 DIAGNOSIS — M1811 Unilateral primary osteoarthritis of first carpometacarpal joint, right hand: Secondary | ICD-10-CM | POA: Diagnosis not present

## 2024-11-01 MED ORDER — PREDNISONE 50 MG PO TABS: ORAL_TABLET | ORAL | 0 refills | Status: AC

## 2024-11-01 NOTE — Progress Notes (Unsigned)
   Acute Office Visit  Subjective:     Patient ID: Angelica Hoover, female    DOB: 11-Jun-1951, 73 y.o.   MRN: [id number]   HPI Patient is in today for *** Xray 2021   ROS      Objective:    There were no vitals taken for this visit. {Vitals History (Optional):23777}  Physical Exam  No results found for any visits on 11/01/24.      Assessment & Plan:   Problem List Items Addressed This Visit   None Visit Diagnoses       Acute gout of right hand, unspecified cause    -  Primary       No orders of the defined types were placed in this encounter.   No follow-ups on file.  Shawne Eskelson, PA-C

## 2024-11-01 NOTE — Patient Instructions (Signed)
 Take one tablet daily of prednisone .  Will get labs to check uric acid.  Consider wearing thumb spica for rest.  Consider sports medicine referral for injection.  Voltaren  gel as needed up to 4 times a day.   Gout  Gout is painful swelling of your joints. Gout is a type of arthritis. It is caused by having too much uric acid in your body. Uric acid is a chemical that is made when your body breaks down substances called purines. If your body has too much uric acid, sharp crystals can form and build up in your joints. This causes pain and swelling. Gout attacks can happen quickly and be very painful (acute gout). Over time, the attacks can affect more joints and happen more often (chronic gout). What are the causes? Gout is caused by too much uric acid in your blood. This can happen because: Your kidneys do not remove enough uric acid from your blood. Your body makes too much uric acid. You eat too many foods that are high in purines. These foods include organ meats, some seafood, and beer. Trauma or stress can bring on an attack. What increases the risk? Having a family history of gout. Being female and middle-aged. Being female and having gone through menopause. Having an organ transplant. Taking certain medicines. Having certain conditions, such as: Being very overweight (obese). Lead poisoning. Kidney disease. A skin condition called psoriasis. Other risks include: Losing weight too quickly. Not having enough water in the body (being dehydrated). Drinking alcohol, especially beer. Drinking beverages that are sweetened with a type of sugar called fructose. What are the signs or symptoms? An attack of acute gout often starts at night and usually happens in just one joint. The most common place is the big toe. Other joints that may be affected include joints of the feet, ankle, knee, fingers, wrist, or elbow. Symptoms may include: Very bad  pain. Warmth. Swelling. Stiffness. Tenderness. The affected joint may be very painful to touch. Shiny, red, or purple skin. Chills and fever. Chronic gout may cause symptoms more often. More joints may be involved. You may also have white or yellow lumps (tophi) on your hands or feet or in other areas near your joints. How is this treated? Treatment for an acute attack may include medicines for pain and swelling, such as: NSAIDs, such as ibuprofen. Steroids taken by mouth or injected into a joint. Colchicine . This can be given by mouth or through an IV tube. Treatment to prevent future attacks may include: Taking small doses of NSAIDs or colchicine  daily. Using a medicine that reduces uric acid levels in your blood, such as allopurinol . Making changes to your diet. You may need to see a food expert (dietitian) about what to eat and drink to prevent gout. Follow these instructions at home: During a gout attack  If told, put ice on the painful area. To do this: Put ice in a plastic bag. Place a towel between your skin and the bag. Leave the ice on for 20 minutes, 2-3 times a day. Take off the ice if your skin turns bright red. This is very important. If you cannot feel pain, heat, or cold, you have a greater risk of damage to the area. Raise the painful joint above the level of your heart as often as you can. Rest the joint as much as possible. If the joint is in your leg, you may be given crutches. Follow instructions from your doctor about what you cannot eat  or drink. Avoiding future gout attacks Eat a low-purine diet. Avoid foods and drinks such as: Liver. Kidney. Anchovies. Asparagus. Herring. Mushrooms. Mussels. Beer. Stay at a healthy weight. If you want to lose weight, talk with your doctor. Do not lose weight too fast. Start or continue an exercise plan as told by your doctor. Eating and drinking Avoid drinks sweetened by fructose. Drink enough fluids to keep your pee  (urine) pale yellow. If you drink alcohol: Limit how much you have to: 0-1 drink a day for women who are not pregnant. 0-2 drinks a day for men. Know how much alcohol is in a drink. In the [place] one drink equals one 12 oz bottle of beer (355 mL), one 5 oz glass of wine (148 mL), or one 1 oz glass of hard liquor (44 mL). General instructions Take over-the-counter and prescription medicines only as told by your doctor. Ask your doctor if you should avoid driving or using machines while you are taking your medicine. Return to your normal activities when your doctor says that it is safe. Keep all follow-up visits. Where to find more information [employer]: www.niams.http://www.myers.net/ Contact a doctor if: You have another gout attack. You still have symptoms of a gout attack after 10 days of treatment. You have problems (side effects) because of your medicines. You have chills or a fever. You have burning pain when you pee (urinate). You have pain in your lower back or belly. Get help right away if: You have very bad pain. Your pain cannot be controlled. You cannot pee. Summary Gout is painful swelling of the joints. The most common site of pain is the big toe, but it can affect other joints. Medicines and avoiding some foods can help to prevent and treat gout attacks. This information is not intended to replace advice given to you by your health care provider. Make sure you discuss any questions you have with your health care provider. Document Revised: 08/14/2021 Document Reviewed: 08/14/2021 Elsevier Patient Education  2024 [employer].

## 2024-11-02 ENCOUNTER — Encounter: Payer: Self-pay | Admitting: Physician Assistant

## 2024-11-02 ENCOUNTER — Ambulatory Visit: Payer: Self-pay | Admitting: Physician Assistant

## 2024-11-02 DIAGNOSIS — M109 Gout, unspecified: Secondary | ICD-10-CM | POA: Insufficient documentation

## 2024-11-02 DIAGNOSIS — M1811 Unilateral primary osteoarthritis of first carpometacarpal joint, right hand: Secondary | ICD-10-CM | POA: Insufficient documentation

## 2024-11-02 LAB — CBC WITH DIFFERENTIAL/PLATELET
Basophils Absolute: 0.1 x10E3/uL (ref 0.0–0.2)
Basos: 1 %
EOS (ABSOLUTE): 0.4 x10E3/uL (ref 0.0–0.4)
Eos: 5 %
Hematocrit: 41.2 % (ref 34.0–46.6)
Hemoglobin: 13.9 g/dL (ref 11.1–15.9)
Immature Grans (Abs): 0 x10E3/uL (ref 0.0–0.1)
Immature Granulocytes: 0 %
Lymphocytes Absolute: 3.3 x10E3/uL — ABNORMAL HIGH (ref 0.7–3.1)
Lymphs: 35 %
MCH: 31 pg (ref 26.6–33.0)
MCHC: 33.7 g/dL (ref 31.5–35.7)
MCV: 92 fL (ref 79–97)
Monocytes Absolute: 0.9 x10E3/uL (ref 0.1–0.9)
Monocytes: 10 %
Neutrophils Absolute: 4.6 x10E3/uL (ref 1.4–7.0)
Neutrophils: 49 %
Platelets: 325 x10E3/uL (ref 150–450)
RBC: 4.48 x10E6/uL (ref 3.77–5.28)
RDW: 13 % (ref 11.7–15.4)
WBC: 9.5 x10E3/uL (ref 3.4–10.8)

## 2024-11-02 LAB — CMP14+EGFR
ALT: 21 IU/L (ref 0–32)
AST: 18 IU/L (ref 0–40)
Albumin: 4.4 g/dL (ref 3.8–4.8)
Alkaline Phosphatase: 87 IU/L (ref 49–135)
BUN/Creatinine Ratio: 19 (ref 12–28)
BUN: 20 mg/dL (ref 8–27)
Bilirubin Total: 0.4 mg/dL (ref 0.0–1.2)
CO2: 22 mmol/L (ref 20–29)
Calcium: 10.2 mg/dL (ref 8.7–10.3)
Chloride: 102 mmol/L (ref 96–106)
Creatinine, Ser: 1.04 mg/dL — ABNORMAL HIGH (ref 0.57–1.00)
Globulin, Total: 2.6 g/dL (ref 1.5–4.5)
Glucose: 85 mg/dL (ref 70–99)
Potassium: 4.2 mmol/L (ref 3.5–5.2)
Sodium: 138 mmol/L (ref 134–144)
Total Protein: 7 g/dL (ref 6.0–8.5)
eGFR: 57 mL/min/1.73 — ABNORMAL LOW (ref >59)

## 2024-11-02 LAB — URIC ACID: Uric Acid: 4.4 mg/dL (ref 3.1–7.9)

## 2024-11-02 LAB — SEDIMENTATION RATE: Sed Rate: 40 mm/h (ref 0–40)

## 2024-11-02 NOTE — Progress Notes (Signed)
 Angelica Hoover,   Kidney function is improving.  Calcium normal.  Uric acid is normal. No changes need to be made to allopurinol .  I think your pain is likely more osteoarthritis than gout.  Treatment plan stays the same.

## 2024-11-09 NOTE — Progress Notes (Signed)
 Pharmacy Quality Measure Review  This patient is appearing on a report for being at risk of failing the adherence measure for diabetes medications this calendar year.   Medication: Farxiga  5 mg Last fill date: 10/19/24 for 90 day supply  Insurance report was not up to date. No action needed at this time.   Jenkins Graces, PharmD PGY1 Pharmacy Resident

## 2024-12-14 ENCOUNTER — Other Ambulatory Visit: Payer: Self-pay | Admitting: Physician Assistant

## 2024-12-14 DIAGNOSIS — M109 Gout, unspecified: Secondary | ICD-10-CM

## 2024-12-30 ENCOUNTER — Telehealth: Payer: Self-pay

## 2024-12-30 DIAGNOSIS — M109 Gout, unspecified: Secondary | ICD-10-CM

## 2024-12-30 NOTE — Telephone Encounter (Signed)
 Requesting rx rf of allopurinol  100mg   Last written 12/14/2024 as 90 day supply  Spoke with patient who states she was told by [facility] that they did not have this refill  Called pharmacy and they did have the refill it was just placed on hold- ran through insurance as no charge. They will get this ready for the patient to pick up. Patient informed.

## 2024-12-30 NOTE — Telephone Encounter (Signed)
 Copied from CRM #[id number]. Topic: Clinical - Medication Refill >> Dec 30, 2024 10:27 AM Delon T wrote: Medication: allopurinol  (ZYLOPRIM ) 100 MG tablet  Has the patient contacted their pharmacy? Yes (Agent: If no, request that the patient contact the pharmacy for the refill. If patient does not wish to contact the pharmacy document the reason why and proceed with request.) (Agent: If yes, when and what did the pharmacy advise?)  This is the patient's preferred pharmacy:  [facility] [address], [place] - [address] [place] [address] Phone: [phone] Fax: [phone]  Is this the correct pharmacy for this prescription? Yes If no, delete pharmacy and type the correct one.   Has the prescription been filled recently? Yes  Is the patient out of the medication? Yes  Has the patient been seen for an appointment in the last year OR does the patient have an upcoming appointment? Yes  Can we respond through MyChart? Yes  Agent: Please be advised that Rx refills may take up to 3 business days. We ask that you follow-up with your pharmacy.

## 2025-01-09 ENCOUNTER — Ambulatory Visit: Payer: Self-pay | Admitting: Physician Assistant
